# Patient Record
Sex: Female | Born: 1935 | Race: White | Hispanic: No | Marital: Married | Smoking: Former smoker
Health system: Southern US, Community
[De-identification: ages and names within clinical notes are randomized; demographics above are authoritative.]

## PROBLEM LIST (undated history)

## (undated) DIAGNOSIS — N289 Disorder of kidney and ureter, unspecified: Secondary | ICD-10-CM

## (undated) DIAGNOSIS — N183 Chronic kidney disease, stage 3 unspecified: Secondary | ICD-10-CM

## (undated) DIAGNOSIS — F329 Major depressive disorder, single episode, unspecified: Secondary | ICD-10-CM

## (undated) DIAGNOSIS — F209 Schizophrenia, unspecified: Secondary | ICD-10-CM

## (undated) DIAGNOSIS — D509 Iron deficiency anemia, unspecified: Secondary | ICD-10-CM

## (undated) DIAGNOSIS — E46 Unspecified protein-calorie malnutrition: Secondary | ICD-10-CM

## (undated) DIAGNOSIS — F32A Depression, unspecified: Secondary | ICD-10-CM

## (undated) DIAGNOSIS — I5041 Acute combined systolic (congestive) and diastolic (congestive) heart failure: Secondary | ICD-10-CM

## (undated) DIAGNOSIS — I509 Heart failure, unspecified: Secondary | ICD-10-CM

## (undated) DIAGNOSIS — I119 Hypertensive heart disease without heart failure: Secondary | ICD-10-CM

## (undated) DIAGNOSIS — W19XXXA Unspecified fall, initial encounter: Secondary | ICD-10-CM

## (undated) HISTORY — PX: ANKLE SURGERY: SHX546

---

## 2004-06-26 ENCOUNTER — Ambulatory Visit: Payer: Self-pay | Admitting: Family Medicine

## 2004-08-11 ENCOUNTER — Ambulatory Visit: Payer: Self-pay | Admitting: Internal Medicine

## 2004-08-17 ENCOUNTER — Ambulatory Visit: Payer: Self-pay | Admitting: Internal Medicine

## 2004-09-17 ENCOUNTER — Ambulatory Visit: Payer: Self-pay | Admitting: Internal Medicine

## 2004-10-17 ENCOUNTER — Ambulatory Visit: Payer: Self-pay | Admitting: Internal Medicine

## 2004-11-17 ENCOUNTER — Ambulatory Visit: Payer: Self-pay | Admitting: Internal Medicine

## 2004-12-18 ENCOUNTER — Ambulatory Visit: Payer: Self-pay | Admitting: Internal Medicine

## 2005-01-17 ENCOUNTER — Ambulatory Visit: Payer: Self-pay | Admitting: Internal Medicine

## 2005-02-17 ENCOUNTER — Ambulatory Visit: Payer: Self-pay | Admitting: Internal Medicine

## 2005-03-19 ENCOUNTER — Ambulatory Visit: Payer: Self-pay | Admitting: Internal Medicine

## 2005-04-19 ENCOUNTER — Ambulatory Visit: Payer: Self-pay | Admitting: Internal Medicine

## 2005-05-20 ENCOUNTER — Ambulatory Visit: Payer: Self-pay | Admitting: Internal Medicine

## 2005-06-17 ENCOUNTER — Ambulatory Visit: Payer: Self-pay | Admitting: General Practice

## 2005-07-01 ENCOUNTER — Ambulatory Visit: Payer: Self-pay | Admitting: Internal Medicine

## 2005-07-18 ENCOUNTER — Ambulatory Visit: Payer: Self-pay | Admitting: Internal Medicine

## 2005-08-17 ENCOUNTER — Ambulatory Visit: Payer: Self-pay | Admitting: Internal Medicine

## 2005-09-07 ENCOUNTER — Ambulatory Visit: Payer: Self-pay | Admitting: Family Medicine

## 2005-09-17 ENCOUNTER — Ambulatory Visit: Payer: Self-pay | Admitting: Internal Medicine

## 2005-10-17 ENCOUNTER — Ambulatory Visit: Payer: Self-pay | Admitting: Internal Medicine

## 2005-11-17 ENCOUNTER — Ambulatory Visit: Payer: Self-pay | Admitting: Internal Medicine

## 2005-12-18 ENCOUNTER — Ambulatory Visit: Payer: Self-pay | Admitting: Internal Medicine

## 2006-01-20 ENCOUNTER — Ambulatory Visit: Payer: Self-pay | Admitting: Internal Medicine

## 2006-03-03 ENCOUNTER — Ambulatory Visit: Payer: Self-pay | Admitting: Internal Medicine

## 2006-03-05 ENCOUNTER — Ambulatory Visit: Payer: Self-pay | Admitting: General Practice

## 2006-03-19 ENCOUNTER — Ambulatory Visit: Payer: Self-pay | Admitting: Internal Medicine

## 2006-04-19 ENCOUNTER — Ambulatory Visit: Payer: Self-pay | Admitting: Internal Medicine

## 2006-04-19 HISTORY — PX: OTHER SURGICAL HISTORY: SHX169

## 2006-05-26 ENCOUNTER — Ambulatory Visit: Payer: Self-pay | Admitting: Internal Medicine

## 2006-06-18 ENCOUNTER — Ambulatory Visit: Payer: Self-pay | Admitting: Internal Medicine

## 2006-07-19 ENCOUNTER — Ambulatory Visit: Payer: Self-pay | Admitting: Internal Medicine

## 2006-08-31 ENCOUNTER — Ambulatory Visit: Payer: Self-pay | Admitting: Internal Medicine

## 2006-09-09 ENCOUNTER — Ambulatory Visit: Payer: Self-pay | Admitting: Family Medicine

## 2006-09-18 ENCOUNTER — Ambulatory Visit: Payer: Self-pay | Admitting: Internal Medicine

## 2006-10-07 ENCOUNTER — Ambulatory Visit: Payer: Self-pay | Admitting: General Surgery

## 2006-10-07 LAB — HM COLONOSCOPY

## 2006-10-26 ENCOUNTER — Ambulatory Visit: Payer: Self-pay | Admitting: Internal Medicine

## 2006-11-18 ENCOUNTER — Ambulatory Visit: Payer: Self-pay | Admitting: Internal Medicine

## 2006-12-21 ENCOUNTER — Ambulatory Visit: Payer: Self-pay | Admitting: Internal Medicine

## 2007-01-18 ENCOUNTER — Ambulatory Visit: Payer: Self-pay | Admitting: Internal Medicine

## 2007-02-18 ENCOUNTER — Ambulatory Visit: Payer: Self-pay | Admitting: Internal Medicine

## 2007-03-20 ENCOUNTER — Ambulatory Visit: Payer: Self-pay | Admitting: Internal Medicine

## 2007-05-10 ENCOUNTER — Ambulatory Visit: Payer: Self-pay | Admitting: Internal Medicine

## 2007-05-21 ENCOUNTER — Ambulatory Visit: Payer: Self-pay | Admitting: Internal Medicine

## 2007-06-18 ENCOUNTER — Ambulatory Visit: Payer: Self-pay | Admitting: Internal Medicine

## 2007-07-19 ENCOUNTER — Ambulatory Visit: Payer: Self-pay | Admitting: Internal Medicine

## 2007-08-18 ENCOUNTER — Ambulatory Visit: Payer: Self-pay | Admitting: Internal Medicine

## 2007-08-30 ENCOUNTER — Ambulatory Visit: Payer: Self-pay | Admitting: Internal Medicine

## 2007-09-11 ENCOUNTER — Ambulatory Visit: Payer: Self-pay | Admitting: Family Medicine

## 2007-09-18 ENCOUNTER — Ambulatory Visit: Payer: Self-pay | Admitting: Internal Medicine

## 2007-10-18 ENCOUNTER — Ambulatory Visit: Payer: Self-pay | Admitting: Internal Medicine

## 2007-11-18 ENCOUNTER — Ambulatory Visit: Payer: Self-pay | Admitting: Internal Medicine

## 2007-12-20 ENCOUNTER — Ambulatory Visit: Payer: Self-pay | Admitting: Internal Medicine

## 2008-01-10 ENCOUNTER — Inpatient Hospital Stay (HOSPITAL_COMMUNITY): Admission: RE | Admit: 2008-01-10 | Discharge: 2008-01-11 | Payer: Self-pay | Admitting: Neurosurgery

## 2008-01-10 HISTORY — PX: OTHER SURGICAL HISTORY: SHX169

## 2008-01-16 ENCOUNTER — Inpatient Hospital Stay: Payer: Self-pay | Admitting: Internal Medicine

## 2008-01-18 ENCOUNTER — Ambulatory Visit: Payer: Self-pay | Admitting: Internal Medicine

## 2008-01-19 ENCOUNTER — Ambulatory Visit: Payer: Self-pay | Admitting: Internal Medicine

## 2008-01-19 ENCOUNTER — Encounter: Payer: Self-pay | Admitting: Internal Medicine

## 2008-02-18 ENCOUNTER — Ambulatory Visit: Payer: Self-pay | Admitting: Internal Medicine

## 2008-03-19 ENCOUNTER — Ambulatory Visit: Payer: Self-pay | Admitting: Internal Medicine

## 2008-04-19 ENCOUNTER — Ambulatory Visit: Payer: Self-pay | Admitting: Internal Medicine

## 2008-05-20 ENCOUNTER — Ambulatory Visit: Payer: Self-pay | Admitting: Internal Medicine

## 2008-06-17 ENCOUNTER — Ambulatory Visit: Payer: Self-pay | Admitting: Internal Medicine

## 2008-07-18 ENCOUNTER — Ambulatory Visit: Payer: Self-pay | Admitting: Internal Medicine

## 2008-08-17 ENCOUNTER — Ambulatory Visit: Payer: Self-pay | Admitting: Internal Medicine

## 2008-09-17 ENCOUNTER — Ambulatory Visit: Payer: Self-pay | Admitting: Internal Medicine

## 2008-11-11 ENCOUNTER — Ambulatory Visit: Payer: Self-pay | Admitting: Family Medicine

## 2008-11-13 ENCOUNTER — Ambulatory Visit: Payer: Self-pay | Admitting: Internal Medicine

## 2008-12-02 ENCOUNTER — Ambulatory Visit: Payer: Self-pay | Admitting: Nephrology

## 2008-12-11 ENCOUNTER — Ambulatory Visit: Payer: Self-pay | Admitting: Internal Medicine

## 2008-12-18 ENCOUNTER — Ambulatory Visit: Payer: Self-pay | Admitting: Internal Medicine

## 2009-01-17 ENCOUNTER — Ambulatory Visit: Payer: Self-pay | Admitting: Internal Medicine

## 2009-02-17 ENCOUNTER — Ambulatory Visit: Payer: Self-pay | Admitting: Internal Medicine

## 2009-03-19 ENCOUNTER — Ambulatory Visit: Payer: Self-pay | Admitting: Internal Medicine

## 2009-04-19 ENCOUNTER — Ambulatory Visit: Payer: Self-pay | Admitting: Internal Medicine

## 2009-05-28 ENCOUNTER — Ambulatory Visit: Payer: Self-pay | Admitting: Internal Medicine

## 2009-05-29 LAB — HM DEXA SCAN: HM DEXA SCAN: NORMAL

## 2009-06-17 ENCOUNTER — Ambulatory Visit: Payer: Self-pay | Admitting: Internal Medicine

## 2009-07-24 ENCOUNTER — Ambulatory Visit: Payer: Self-pay | Admitting: Internal Medicine

## 2009-08-17 ENCOUNTER — Ambulatory Visit: Payer: Self-pay | Admitting: Internal Medicine

## 2009-09-17 ENCOUNTER — Ambulatory Visit: Payer: Self-pay | Admitting: Internal Medicine

## 2009-10-17 ENCOUNTER — Ambulatory Visit: Payer: Self-pay | Admitting: Internal Medicine

## 2009-11-12 ENCOUNTER — Ambulatory Visit: Payer: Self-pay | Admitting: Internal Medicine

## 2009-11-14 ENCOUNTER — Ambulatory Visit: Payer: Self-pay | Admitting: Family Medicine

## 2009-11-14 LAB — HM MAMMOGRAPHY

## 2009-11-17 ENCOUNTER — Ambulatory Visit: Payer: Self-pay | Admitting: Internal Medicine

## 2009-12-18 ENCOUNTER — Ambulatory Visit: Payer: Self-pay | Admitting: Internal Medicine

## 2010-01-17 ENCOUNTER — Ambulatory Visit: Payer: Self-pay | Admitting: Internal Medicine

## 2010-03-11 ENCOUNTER — Ambulatory Visit: Payer: Self-pay | Admitting: Internal Medicine

## 2010-03-19 ENCOUNTER — Ambulatory Visit: Payer: Self-pay | Admitting: Internal Medicine

## 2010-05-06 ENCOUNTER — Ambulatory Visit: Payer: Self-pay | Admitting: Internal Medicine

## 2010-05-20 ENCOUNTER — Ambulatory Visit: Payer: Self-pay | Admitting: Internal Medicine

## 2010-07-01 ENCOUNTER — Ambulatory Visit: Payer: Self-pay | Admitting: Internal Medicine

## 2010-07-19 ENCOUNTER — Ambulatory Visit: Payer: Self-pay | Admitting: Internal Medicine

## 2010-08-27 ENCOUNTER — Ambulatory Visit: Payer: Self-pay | Admitting: Internal Medicine

## 2010-09-01 NOTE — Op Note (Signed)
Courtney Wright, BONNIE NO.:  [id number]   MEDICAL RECORD NO.:  [id number]          PATIENT TYPE:  INP   LOCATION:  2899                         FACILITY:  [facility]   PHYSICIAN:  Cristi Loron, M.D.DATE OF BIRTH:  1924-12-13   DATE OF PROCEDURE:  DATE OF DISCHARGE:                               OPERATIVE REPORT   BRIEF HISTORY:  The patient is a 77 year old white female who has  suffered from a neck and arm pain consistent with C5 and C6  radiculopathy.  She failed medical management work up with a cervical  MRI, which demonstrated the patient had spondylosis and stenosis at C4-  C5 and C5-C6.  I discussed the various treatment options with the  patient including surgery.  The patient has weighed the risks, benefits,  and alternatives of surgery, and decided to proceed with C4-C5 and C5-C6  anterior cervical diskectomy with fusion plating.   PREOPERATIVE DIAGNOSES:  C4-C5 and C5-C6 spondylosis, stenosis, cervical  radiculopathy, transverse myelopathy, and cervicalgia.   POSTOPERATIVE DIAGNOSES:  C4-C5 and C5-C6 spondylosis, stenosis,  cervical radiculopathy, transverse myelopathy, and cervicalgia.   PROCEDURES:  C4-C5 and C5-C6 extensive anterior cervical  diskectomy/decompression; C4-C5 and C5-C6 anterior interbody arthrodesis  with local autograft bone, and Actifuse bone graft extender; insertion  of interbody prosthesis at C4-C5 and C5-C6 (Alphatec PEEK interbody  prosthesis); anterior cervical plating C4-C6 with Codman SLIM-LOC  titanium plate and screws.   SURGEON:  Cristi Loron, MD   ASSISTANT:  Hewitt Shorts, MD   ANESTHESIA:  General endotracheal.   ESTIMATED BLOOD LOSS:  75 mL.   SPECIMENS:  None.   DRAINS:  None.   COMPLICATIONS:  None.   DESCRIPTION OF PROCEDURE:  The patient was brought to the operating room  by the Anesthesia Team.  General endotracheal anesthesia was induced.  The patient remained in the supine position.  A  roll was placed under  her shoulders and placed the neck in slight extension.  Anterior  cervical region was then prepared with Betadine scrub and Betadine  solution.  Sterile drapes were applied.  I then injected the area to mid-  size with Marcaine and with epinephrine solution.  I used a scalpel to  make a transverse incision in the patient's left anterior neck.  I then  used the Metzenbaum scissors to divide the platysmal muscle and then to  dissect the medial to sternocleidomastoid muscle, jugular vein, and  carotid artery.  I carefully dissected down towards the anterior  cervical spine identifying the esophagus and retracting it medially.  I  then used Kitner swabs to clear soft tissue from the anterior cervical  spine, and we then inserted a bent spinal needle into the upper exposed  intervertebral disk space.  We obtained intraoperative radiograph to  confirm our location.   We then used electrocautery to detach the medial border of the longus  colli muscle bilaterally from C4-C5 and C5-C6 intervertebral disk space.  We then inserted the Caspar self-retaining retractor underneath the  longus colli muscle bilaterally to provide exposure.   We began the decompression at C4-C5.  We  incised the C4-C5  intervertebral disk and performed a partial intervertebral diskectomy  using Carlens curettes and pituitary forceps.  We then inserted the  distraction screws at C4 and C5, distracted the interspace.  We then  used the high-speed drill to decorticate the vertebral endplates at C4  and C5.  Drilled away the remainder of C4-C5 intervertebral disk,  drilled away some posterior spondylosis, and to thin out the posterior  longitudinal ligament.  We then incised the ligament with a raccoon  knife and then removed using a Kerrison punch undercutting the vertebral  endplates and decompressing the thecal sac.  We then performed  foraminotomies about the bilateral C5 nerve root, completing  the  decompression at this level.   We then repeated this procedure in an analogous fashion at C5-C6  decompressing the C5-C6 thecal sac and bilateral C6 nerve roots.   After completing the decompression at C4-C5 and C5-C6, we now turned our  attention to the arthrodesis.  We used trial spacers and determined to  use a medium 4-mm PEEK interbody prosthesis at both levels.  We  prefilled this prosthesis with a combination of local autograft bone, we  obtained on the decompression as well as Actifuse bone graft extender.  We inserted the prostheses and distracted the C5-C6 and C4-C5  interspaces.  We then removed the distraction screws.  There was a good  snug fit of the prosthesis at both levels.   We now turned our attention to the anterior spinal instrumentation.  We  obtained the appropriate-length Codman SLIM-LOC anterior cervical plate.  We laid it along the anterior aspect of the vertebral bodies from C4-C6.  We drilled two 12-mm holes at C4-C5 and C5-C6 and then secured the plate  to the vertebral bodies by placing two 12-mm self-tapping screws at C4-  C5-C6.  We then obtained the intraoperative radiograph, it demonstrated  good position of the upper plate screws and interbody prosthesis (we  could not see the lower plate screws because of the patient's shoulder).  The construct looked good in vivo.  We therefore, secured the screws and  plate by locking each cam.   We then obtained hemostasis using bipolar electrocautery.  We irrigated  the wound out with bacitracin solution, removed the retractors, and  inspected the esophagus for any damage; there was none apparent.  We  then reapproximated the patient's platysma muscle with interrupted 3-0  Vicryl suture, the subcutaneous using interrupted 3-0 Vicryl suture, and  the skin with Steri-Strips and Benzoin.  The wound was then coated with  bacitracin ointment.  A sterile dressing was applied.  The drapes were  removed, and the  patient was subsequently extubated by the Anesthesia  Team and transported to the postanesthesia care unit in stable  condition.  All sponge, instrument, and needle counts were correct at  the end of this case.      Cristi Loron, M.D.  Electronically Signed     JDJ/MEDQ  D:  01/10/2008  T:  01/11/2008  Job:  [id number]

## 2010-09-18 ENCOUNTER — Ambulatory Visit: Payer: Self-pay | Admitting: Internal Medicine

## 2010-10-29 ENCOUNTER — Ambulatory Visit: Payer: Self-pay | Admitting: Internal Medicine

## 2010-11-18 ENCOUNTER — Ambulatory Visit: Payer: Self-pay | Admitting: Internal Medicine

## 2010-12-19 ENCOUNTER — Ambulatory Visit: Payer: Self-pay | Admitting: Internal Medicine

## 2011-01-18 LAB — CBC
Hemoglobin: 10.2 — ABNORMAL LOW
MCHC: 33.1
RBC: 3.48 — ABNORMAL LOW
WBC: 5

## 2011-01-18 LAB — BASIC METABOLIC PANEL
CO2: 28
Calcium: 9.6
GFR calc Af Amer: 39 — ABNORMAL LOW
Sodium: 137

## 2011-02-10 ENCOUNTER — Ambulatory Visit: Payer: Self-pay | Admitting: Internal Medicine

## 2011-02-18 ENCOUNTER — Ambulatory Visit: Payer: Self-pay | Admitting: Internal Medicine

## 2011-04-07 ENCOUNTER — Ambulatory Visit: Payer: Self-pay | Admitting: Internal Medicine

## 2011-04-20 ENCOUNTER — Ambulatory Visit: Payer: Self-pay | Admitting: Internal Medicine

## 2011-06-02 ENCOUNTER — Ambulatory Visit: Payer: Self-pay | Admitting: Internal Medicine

## 2011-06-02 LAB — CANCER CENTER HEMOGLOBIN: HGB: 10.6 g/dL — ABNORMAL LOW (ref 12.0–16.0)

## 2011-06-18 ENCOUNTER — Ambulatory Visit: Payer: Self-pay | Admitting: Internal Medicine

## 2011-07-28 ENCOUNTER — Ambulatory Visit: Payer: Self-pay | Admitting: Internal Medicine

## 2011-07-28 LAB — IRON AND TIBC
Iron Bind.Cap.(Total): 351 ug/dL (ref 250–450)
Iron: 61 ug/dL (ref 50–170)

## 2011-07-28 LAB — FERRITIN: Ferritin (ARMC): 36 ng/mL (ref 8–388)

## 2011-08-18 ENCOUNTER — Ambulatory Visit: Payer: Self-pay | Admitting: Internal Medicine

## 2011-09-22 ENCOUNTER — Ambulatory Visit: Payer: Self-pay | Admitting: Internal Medicine

## 2011-09-22 LAB — CBC CANCER CENTER
Basophil #: 0 x10 3/mm (ref 0.0–0.1)
Eosinophil #: 0.1 x10 3/mm (ref 0.0–0.7)
Eosinophil %: 2.1 %
Lymphocyte #: 2 x10 3/mm (ref 1.0–3.6)
MCH: 29 pg (ref 26.0–34.0)
MCHC: 32.1 g/dL (ref 32.0–36.0)
MCV: 90 fL (ref 80–100)
Monocyte #: 0.9 x10 3/mm (ref 0.2–0.9)
Neutrophil %: 45.7 %

## 2011-10-18 ENCOUNTER — Ambulatory Visit: Payer: Self-pay | Admitting: Internal Medicine

## 2011-12-15 ENCOUNTER — Ambulatory Visit: Payer: Self-pay | Admitting: Internal Medicine

## 2011-12-19 ENCOUNTER — Ambulatory Visit: Payer: Self-pay | Admitting: Internal Medicine

## 2012-03-08 ENCOUNTER — Ambulatory Visit: Payer: Self-pay | Admitting: Internal Medicine

## 2012-03-08 LAB — IRON AND TIBC
Iron Saturation: 21 %
Iron: 78 ug/dL (ref 50–170)

## 2012-03-08 LAB — FERRITIN: Ferritin (ARMC): 43 ng/mL (ref 8–388)

## 2012-03-08 LAB — CANCER CENTER HEMOGLOBIN: HGB: 10.7 g/dL — ABNORMAL LOW (ref 12.0–16.0)

## 2012-03-19 ENCOUNTER — Ambulatory Visit: Payer: Self-pay | Admitting: Internal Medicine

## 2012-05-20 ENCOUNTER — Ambulatory Visit: Payer: Self-pay | Admitting: Internal Medicine

## 2012-06-17 ENCOUNTER — Ambulatory Visit: Payer: Self-pay | Admitting: Internal Medicine

## 2012-08-17 ENCOUNTER — Ambulatory Visit: Payer: Self-pay | Admitting: Internal Medicine

## 2012-08-23 LAB — CBC CANCER CENTER
Basophil %: 0.8 %
HCT: 31 % — ABNORMAL LOW (ref 35.0–47.0)
HGB: 10.2 g/dL — ABNORMAL LOW (ref 12.0–16.0)
Lymphocyte #: 1.6 x10 3/mm (ref 1.0–3.6)
Lymphocyte %: 36.3 %
Monocyte #: 0.8 x10 3/mm (ref 0.2–0.9)
Monocyte %: 17.9 %
Neutrophil #: 1.8 x10 3/mm (ref 1.4–6.5)
RBC: 3.43 10*6/uL — ABNORMAL LOW (ref 3.80–5.20)

## 2012-09-17 ENCOUNTER — Ambulatory Visit: Payer: Self-pay | Admitting: Internal Medicine

## 2012-10-17 ENCOUNTER — Ambulatory Visit: Payer: Self-pay | Admitting: Internal Medicine

## 2012-11-15 LAB — IRON AND TIBC
Iron Bind.Cap.(Total): 365 ug/dL (ref 250–450)
Unbound Iron-Bind.Cap.: 286 ug/dL

## 2012-11-15 LAB — FERRITIN: Ferritin (ARMC): 44 ng/mL (ref 8–388)

## 2012-11-17 ENCOUNTER — Ambulatory Visit: Payer: Self-pay | Admitting: Internal Medicine

## 2013-02-07 ENCOUNTER — Ambulatory Visit: Payer: Self-pay | Admitting: Internal Medicine

## 2013-02-07 LAB — CANCER CENTER HEMOGLOBIN: HGB: 10.9 g/dL — ABNORMAL LOW (ref 12.0–16.0)

## 2013-02-17 ENCOUNTER — Ambulatory Visit: Payer: Self-pay | Admitting: Internal Medicine

## 2013-05-09 ENCOUNTER — Ambulatory Visit: Payer: Self-pay | Admitting: Internal Medicine

## 2013-05-09 LAB — IRON AND TIBC
IRON: 80 ug/dL (ref 50–170)
Iron Bind.Cap.(Total): 383 ug/dL (ref 250–450)
Iron Saturation: 21 %
Unbound Iron-Bind.Cap.: 303 ug/dL

## 2013-05-09 LAB — CANCER CENTER HEMOGLOBIN: HGB: 10.7 g/dL — AB (ref 12.0–16.0)

## 2013-05-09 LAB — FERRITIN: Ferritin (ARMC): 25 ng/mL (ref 8–388)

## 2013-05-20 ENCOUNTER — Ambulatory Visit: Payer: Self-pay | Admitting: Internal Medicine

## 2013-07-24 ENCOUNTER — Ambulatory Visit: Payer: Self-pay | Admitting: Internal Medicine

## 2013-07-25 LAB — CBC CANCER CENTER
BASOS ABS: 0 x10 3/mm (ref 0.0–0.1)
Basophil %: 0.8 %
EOS PCT: 2.8 %
Eosinophil #: 0.2 x10 3/mm (ref 0.0–0.7)
HCT: 32.4 % — AB (ref 35.0–47.0)
HGB: 10.3 g/dL — AB (ref 12.0–16.0)
Lymphocyte #: 1.8 x10 3/mm (ref 1.0–3.6)
Lymphocyte %: 30.5 %
MCH: 28.6 pg (ref 26.0–34.0)
MCHC: 31.6 g/dL — ABNORMAL LOW (ref 32.0–36.0)
MCV: 91 fL (ref 80–100)
Monocyte #: 1 x10 3/mm — ABNORMAL HIGH (ref 0.2–0.9)
Monocyte %: 15.9 %
NEUTROS ABS: 3 x10 3/mm (ref 1.4–6.5)
Neutrophil %: 50 %
Platelet: 186 x10 3/mm (ref 150–440)
RBC: 3.58 10*6/uL — ABNORMAL LOW (ref 3.80–5.20)
RDW: 13.3 % (ref 11.5–14.5)
WBC: 6 x10 3/mm (ref 3.6–11.0)

## 2013-08-17 ENCOUNTER — Ambulatory Visit: Payer: Self-pay | Admitting: Internal Medicine

## 2014-11-04 ENCOUNTER — Other Ambulatory Visit: Payer: Self-pay | Admitting: Family Medicine

## 2014-11-04 DIAGNOSIS — M1990 Unspecified osteoarthritis, unspecified site: Secondary | ICD-10-CM | POA: Insufficient documentation

## 2014-11-04 MED ORDER — OXYCODONE-ACETAMINOPHEN 10-325 MG PO TABS: ORAL_TABLET | ORAL | Status: DC

## 2014-11-04 NOTE — Telephone Encounter (Signed)
Last ov 12/03/13. Last printed rx on 09/09/14

## 2014-11-04 NOTE — Telephone Encounter (Signed)
Needs ov. Thanks.  

## 2014-11-04 NOTE — Telephone Encounter (Signed)
Hydrocodone 10/325 mg Pt needs to pick up RX  Her daughter will pick it up  [facility]  Thanks TP

## 2014-11-11 ENCOUNTER — Encounter: Payer: Self-pay | Admitting: Family Medicine

## 2014-11-11 ENCOUNTER — Telehealth: Payer: Self-pay

## 2014-11-11 ENCOUNTER — Ambulatory Visit (INDEPENDENT_AMBULATORY_CARE_PROVIDER_SITE_OTHER): Payer: Medicare Other | Admitting: Family Medicine

## 2014-11-11 VITALS — BP 140/78 | HR 78 | Temp 98.0°F | Resp 15 | Wt 168.6 lb

## 2014-11-11 DIAGNOSIS — R079 Chest pain, unspecified: Secondary | ICD-10-CM | POA: Insufficient documentation

## 2014-11-11 DIAGNOSIS — R002 Palpitations: Secondary | ICD-10-CM

## 2014-11-11 DIAGNOSIS — K5900 Constipation, unspecified: Secondary | ICD-10-CM | POA: Insufficient documentation

## 2014-11-11 DIAGNOSIS — N183 Chronic kidney disease, stage 3 unspecified: Secondary | ICD-10-CM | POA: Insufficient documentation

## 2014-11-11 DIAGNOSIS — D649 Anemia, unspecified: Secondary | ICD-10-CM | POA: Diagnosis not present

## 2014-11-11 DIAGNOSIS — D631 Anemia in chronic kidney disease: Secondary | ICD-10-CM

## 2014-11-11 DIAGNOSIS — R05 Cough: Secondary | ICD-10-CM | POA: Insufficient documentation

## 2014-11-11 DIAGNOSIS — F419 Anxiety disorder, unspecified: Secondary | ICD-10-CM | POA: Diagnosis not present

## 2014-11-11 DIAGNOSIS — R0602 Shortness of breath: Secondary | ICD-10-CM | POA: Insufficient documentation

## 2014-11-11 DIAGNOSIS — R42 Dizziness and giddiness: Secondary | ICD-10-CM | POA: Diagnosis not present

## 2014-11-11 DIAGNOSIS — R569 Unspecified convulsions: Secondary | ICD-10-CM | POA: Insufficient documentation

## 2014-11-11 DIAGNOSIS — R609 Edema, unspecified: Secondary | ICD-10-CM | POA: Insufficient documentation

## 2014-11-11 DIAGNOSIS — M549 Dorsalgia, unspecified: Secondary | ICD-10-CM

## 2014-11-11 DIAGNOSIS — I10 Essential (primary) hypertension: Secondary | ICD-10-CM | POA: Diagnosis not present

## 2014-11-11 DIAGNOSIS — N189 Chronic kidney disease, unspecified: Secondary | ICD-10-CM | POA: Insufficient documentation

## 2014-11-11 DIAGNOSIS — R059 Cough, unspecified: Secondary | ICD-10-CM | POA: Insufficient documentation

## 2014-11-11 MED ORDER — CITALOPRAM HYDROBROMIDE 20 MG PO TABS: 20.0000 mg | ORAL_TABLET | Freq: Every day | ORAL | Status: DC

## 2014-11-11 NOTE — Telephone Encounter (Signed)
Called Citalopram(CELEXA) 20 mg prescription  to [facility].

## 2014-11-11 NOTE — Progress Notes (Signed)
   Subjective:    Patient ID: Courtney Wright, female    DOB: 1924-10-03, 81 y.o.   MRN: [id number]  HPI  Courtney Wright is here with chief complain of not been feeling well for the past six months, feeling light-headeness, weakness on and off.  Now for the past weeks the spells have been more frequently.Usually appetite is not good, but now is getting worst. Patient feels anxious when feeling dizzy and can feel heart rate increasing.    Review of Systems  Constitutional: Positive for appetite change and fatigue. Chills: appetite is a lot worst than usual.  HENT: Negative.  Negative for ear discharge and ear pain.   Eyes: Negative.   Respiratory: Positive for cough (cough some).   Cardiovascular: Positive for palpitations (started two weeks ago but this weak is more frequent).  Gastrointestinal: Negative.   Endocrine: Negative.   Genitourinary: Negative.   Musculoskeletal: Negative.   Skin: Positive for color change (looking a little pale).  Allergic/Immunologic: Negative.   Neurological: Positive for dizziness (Have been having spells for two weeks on and off, but this weak is more frequently.), weakness and light-headedness (for 6 weeks).  Hematological: Negative.   Psychiatric/Behavioral: The patient is nervous/anxious (when having the spells).       Objective:   Physical Exam  Constitutional: She is oriented to person, place, and time. She appears well-developed and well-nourished.  Cardiovascular: Normal rate and regular rhythm.   Pulmonary/Chest: Effort normal and breath sounds normal.  Neurological: She is alert and oriented to person, place, and time.  Skin: Skin is warm.   BP 140/78 mmHg  Pulse 78  Temp(Src) 98 F (36.7 C) (Oral)  Resp 15  Wt 168 lb 9.6 oz (76.476 kg)  SpO2 96%      Assessment & Plan:  1. Dizziness Really more lightheaded when SOB.   2. Shortness of breath Will check labs. Has had previously. Will check labs.  - CBC with  Differential/Platelet - Comprehensive metabolic panel  3. Palpitation Suspect related to anxiety. EKG ok today. Will monitor. ER if has symptoms that worsen or do not resolve with rest.   - EKG 12-Lead - TSH  4. Anxiety Suspect cause of symptoms. Has had similar previously.  - citalopram (CELEXA) 20 MG tablet; Take 1 tablet (20 mg total) by mouth daily.  Dispense: 30 tablet; Refill: 3  5. Essential hypertension Stable. Will check labs.  - Comprehensive metabolic panel  6. Normocytic anemia Recheck labs.  - CBC with Differential/Platelet  Recheck ov in 2 weeks.   Lorie Phenix, MD

## 2014-11-12 LAB — COMPREHENSIVE METABOLIC PANEL
A/G RATIO: 1.4 (ref 1.1–2.5)
ALBUMIN: 4.1 g/dL (ref 3.5–4.7)
ALT: 10 IU/L (ref 0–32)
AST: 23 IU/L (ref 0–40)
Alkaline Phosphatase: 59 IU/L (ref 39–117)
BUN / CREAT RATIO: 19 (ref 11–26)
BUN: 20 mg/dL (ref 8–27)
Bilirubin Total: 0.5 mg/dL (ref 0.0–1.2)
CHLORIDE: 99 mmol/L (ref 97–108)
CO2: 22 mmol/L (ref 18–29)
Calcium: 9.9 mg/dL (ref 8.7–10.3)
Creatinine, Ser: 1.08 mg/dL — ABNORMAL HIGH (ref 0.57–1.00)
GFR, EST AFRICAN AMERICAN: 53 mL/min/{1.73_m2} — AB (ref >59)
GFR, EST NON AFRICAN AMERICAN: 46 mL/min/{1.73_m2} — AB (ref >59)
GLOBULIN, TOTAL: 3 g/dL (ref 1.5–4.5)
GLUCOSE: 101 mg/dL — AB (ref 65–99)
Potassium: 4.6 mmol/L (ref 3.5–5.2)
Sodium: 141 mmol/L (ref 134–144)
Total Protein: 7.1 g/dL (ref 6.0–8.5)

## 2014-11-12 LAB — CBC WITH DIFFERENTIAL/PLATELET
BASOS: 0 %
Basophils Absolute: 0 10*3/uL (ref 0.0–0.2)
EOS (ABSOLUTE): 0.1 10*3/uL (ref 0.0–0.4)
EOS: 1 %
Hematocrit: 33.3 % — ABNORMAL LOW (ref 34.0–46.6)
Hemoglobin: 10.5 g/dL — ABNORMAL LOW (ref 11.1–15.9)
IMMATURE GRANS (ABS): 0 10*3/uL (ref 0.0–0.1)
IMMATURE GRANULOCYTES: 0 %
Lymphocytes Absolute: 1.8 10*3/uL (ref 0.7–3.1)
Lymphs: 26 %
MCH: 28.6 pg (ref 26.6–33.0)
MCHC: 31.5 g/dL (ref 31.5–35.7)
MCV: 91 fL (ref 79–97)
MONOS ABS: 1.1 10*3/uL — AB (ref 0.1–0.9)
Monocytes: 16 %
NEUTROS PCT: 57 %
Neutrophils Absolute: 3.8 10*3/uL (ref 1.4–7.0)
PLATELETS: 267 10*3/uL (ref 150–379)
RBC: 3.67 x10E6/uL — AB (ref 3.77–5.28)
RDW: 13.3 % (ref 12.3–15.4)
WBC: 6.8 10*3/uL (ref 3.4–10.8)

## 2014-11-12 LAB — TSH: TSH: 0.204 u[IU]/mL — ABNORMAL LOW (ref 0.450–4.500)

## 2014-11-13 ENCOUNTER — Telehealth: Payer: Self-pay

## 2014-11-13 NOTE — Telephone Encounter (Signed)
Advised pt of lab results. Pt verbally acknowledges understanding. Kylian Loh Drozdowski, CMA   

## 2014-11-25 ENCOUNTER — Telehealth: Payer: Self-pay | Admitting: Family Medicine

## 2014-11-25 DIAGNOSIS — E0590 Thyrotoxicosis, unspecified without thyrotoxic crisis or storm: Secondary | ICD-10-CM | POA: Insufficient documentation

## 2014-11-25 NOTE — Telephone Encounter (Signed)
Printed.  Please notify patient. Thanks.  

## 2014-11-25 NOTE — Telephone Encounter (Signed)
Pt would like to have lab orders sent to [facility] to have thyroid rechecked due to high value on the last test.  Please contact patient when req is ready.

## 2014-11-25 NOTE — Telephone Encounter (Signed)
Lynn advised.   Thanks,   -Laura  

## 2014-11-26 ENCOUNTER — Telehealth: Payer: Self-pay

## 2014-11-26 LAB — T4 AND TSH
T4, Total: 7.4 ug/dL (ref 4.5–12.0)
TSH: 0.348 u[IU]/mL — ABNORMAL LOW (ref 0.450–4.500)

## 2014-11-26 NOTE — Telephone Encounter (Signed)
Spoke with pt regarding the note below, pt verbalized fully understanding.  Thanks,

## 2014-11-26 NOTE — Telephone Encounter (Signed)
-----   Message from Lorie Phenix, MD sent at 11/26/2014  6:43 AM EDT ----- Thyroid stable. Would recheck in 3 months for stability. Thanks.

## 2014-12-12 ENCOUNTER — Encounter: Payer: Self-pay | Admitting: Family Medicine

## 2014-12-12 ENCOUNTER — Ambulatory Visit (INDEPENDENT_AMBULATORY_CARE_PROVIDER_SITE_OTHER): Payer: Medicare Other | Admitting: Family Medicine

## 2014-12-12 VITALS — BP 148/86 | HR 80 | Temp 98.2°F | Resp 16 | Wt 175.0 lb

## 2014-12-12 DIAGNOSIS — D649 Anemia, unspecified: Secondary | ICD-10-CM

## 2014-12-12 DIAGNOSIS — F419 Anxiety disorder, unspecified: Secondary | ICD-10-CM | POA: Diagnosis not present

## 2014-12-12 DIAGNOSIS — E0590 Thyrotoxicosis, unspecified without thyrotoxic crisis or storm: Secondary | ICD-10-CM | POA: Diagnosis not present

## 2014-12-12 DIAGNOSIS — I10 Essential (primary) hypertension: Secondary | ICD-10-CM | POA: Diagnosis not present

## 2014-12-12 DIAGNOSIS — M549 Dorsalgia, unspecified: Secondary | ICD-10-CM

## 2014-12-12 MED ORDER — OXYCODONE-ACETAMINOPHEN 10-325 MG PO TABS: ORAL_TABLET | ORAL | Status: DC

## 2014-12-12 NOTE — Progress Notes (Signed)
Subjective:    Patient ID: Courtney Wright, female    DOB: 03-Jun-1924, 81 y.o.   MRN: [id number]  Dizziness Chronicity: 1 month FU. The problem has been rapidly improving. Associated symptoms include anorexia (decreased appetite), arthralgias (knees) and headaches (occasionally). Pertinent negatives include no abdominal pain, change in bowel habit, chest pain, chills, congestion, coughing, diaphoresis, fatigue, fever, joint swelling, myalgias, nausea, neck pain, numbness, rash, sore throat, swollen glands, urinary symptoms, vertigo (improved since LOV), visual change, vomiting or weakness. Treatments tried: increased Celexa to 20 mg. The treatment provided moderate relief.  Anxiety Presents for follow-up (increased Celexa to 20 mg at LOV) visit. Symptoms include depressed mood, malaise and nervous/anxious behavior. Patient reports no chest pain, confusion, decreased concentration, dizziness, dry mouth, excessive worry, feeling of choking, hyperventilation, irritability, muscle tension, nausea, palpitations, panic, restlessness, shortness of breath or suicidal ideas.   Her past medical history is significant for anxiety/panic attacks. Compliance with prior treatments has been good.   Did not take BP medication today.     Review of Systems  Constitutional: Negative for fever, chills, diaphoresis, irritability and fatigue.  HENT: Negative for congestion and sore throat.   Respiratory: Negative for cough and shortness of breath.   Cardiovascular: Positive for leg swelling. Negative for chest pain and palpitations.  Gastrointestinal: Positive for anorexia (decreased appetite). Negative for nausea, vomiting, abdominal pain and change in bowel habit.  Musculoskeletal: Positive for arthralgias (knees). Negative for myalgias, joint swelling and neck pain.  Skin: Negative for rash.  Neurological: Positive for headaches (occasionally). Negative for dizziness, vertigo (improved since LOV), weakness and  numbness.  Psychiatric/Behavioral: Negative for suicidal ideas, confusion and decreased concentration. The patient is nervous/anxious.    BP 148/86 mmHg  Pulse 80  Temp(Src) 98.2 F (36.8 C) (Oral)  Resp 16  Wt 175 lb (79.379 kg)   Patient Active Problem List   Diagnosis Date Noted  . Subclinical hyperthyroidism 11/25/2014  . Anemia associated with chronic renal failure 11/11/2014  . Anxiety 11/11/2014  . Back ache 11/11/2014  . Chest pain 11/11/2014  . Chronic kidney disease 11/11/2014  . CN (constipation) 11/11/2014  . Cough 11/11/2014  . Accumulation of fluid in tissues 11/11/2014  . BP (high blood pressure) 11/11/2014  . Normocytic anemia 11/11/2014  . Breath shortness 11/11/2014  . Convulsions 11/11/2014  . Back pain 11/11/2014  . DJD (degenerative joint disease) 11/04/2014   No past medical history on file. Current Outpatient Prescriptions on File Prior to Visit  Medication Sig  . amLODipine (NORVASC) 2.5 MG tablet Take by mouth.  . citalopram (CELEXA) 20 MG tablet Take 1 tablet (20 mg total) by mouth daily.  . diclofenac sodium (VOLTAREN) 1 % GEL Place onto the skin.  . furosemide (LASIX) 40 MG tablet Take 40 mg by mouth daily.  Marland Kitchen lisinopril (PRINIVIL,ZESTRIL) 40 MG tablet Take by mouth.  . metolazone (ZAROXOLYN) 2.5 MG tablet Take by mouth.  . metoprolol tartrate (LOPRESSOR) 25 MG tablet Take 25 mg by mouth daily.  Marland Kitchen oxyCODONE-acetaminophen (PERCOCET) 10-325 MG per tablet 1 tablet three times daily by mouth.   No current facility-administered medications on file prior to visit.   Allergies  Allergen Reactions  . Anaprox  [Naproxen]   . Fenoprofen Calcium    Past Surgical History  Procedure Laterality Date  . History of cervical discectomy  01/10/2008    Dr. Lovell Sheehan, [facility]; Fusion and Plating at C4-5 and 5-6  . History of cataract surgery  2008  . Ankle  surgery Left    Social History   Social History  . Marital Status: Married    Spouse Name: N/A   . Number of Children: N/A  . Years of Education: N/A   Occupational History  . Not on file.   Social History Main Topics  . Smoking status: Former Smoker    Quit date: 04/18/1969  . Smokeless tobacco: Never Used  . Alcohol Use: 0.0 oz/week    0 Standard drinks or equivalent per week     Comment: ocassional  . Drug Use: No  . Sexual Activity: Not on file   Other Topics Concern  . Not on file   Social History Narrative   Family History  Problem Relation Age of Onset  . Dementia Mother   . Alzheimer's disease Mother   . Cerebrovascular Accident Father   . Congestive Heart Failure Father   . Rheumatic fever Father   . Heart disease Sister   . Arthritis Sister   . Diabetes Brother     borderline diabetes  . Cancer Brother     Laryngeal Cancer and breast        Objective:   Physical Exam  Constitutional: She is oriented to person, place, and time. She appears well-developed and well-nourished.  Cardiovascular: Normal rate and regular rhythm.   Pulmonary/Chest: Effort normal and breath sounds normal.  Neurological: She is alert and oriented to person, place, and time.  Psychiatric: She has a normal mood and affect. Her behavior is normal. Judgment and thought content normal.   BP 148/86 mmHg  Pulse 80  Temp(Src) 98.2 F (36.8 C) (Oral)  Resp 16  Wt 175 lb (79.379 kg)     Assessment & Plan:  1. Essential hypertension Stable. Will continue current medication.   2. Subclinical hyperthyroidism Stable. Recheck in 3 months.   3. Normocytic anemia Stable.   4. Anxiety Greatly improved on increased medication. Spells have resolved. Recheck in 3 months.    5. Back pain, unspecified location Unchanged. Refilled medication for 3 months.   - oxyCODONE-acetaminophen (PERCOCET) 10-325 MG per tablet; 1 tablet three times daily by mouth.  Dispense: 90 tablet; Refill: 0  Lorie Phenix, MD

## 2015-03-18 ENCOUNTER — Ambulatory Visit: Payer: Medicare Other | Admitting: Family Medicine

## 2015-03-31 ENCOUNTER — Other Ambulatory Visit: Payer: Self-pay | Admitting: Family Medicine

## 2015-03-31 ENCOUNTER — Ambulatory Visit: Payer: Self-pay | Admitting: Family Medicine

## 2015-03-31 ENCOUNTER — Ambulatory Visit: Payer: Medicare Other | Admitting: Family Medicine

## 2015-03-31 DIAGNOSIS — M549 Dorsalgia, unspecified: Secondary | ICD-10-CM

## 2015-03-31 MED ORDER — OXYCODONE-ACETAMINOPHEN 10-325 MG PO TABS: ORAL_TABLET | ORAL | Status: DC

## 2015-03-31 NOTE — Telephone Encounter (Signed)
Prescription printed. Please notify patient it is ready for pick up. Thanks- Dr. Marquis Diles.  

## 2015-03-31 NOTE — Telephone Encounter (Signed)
Pt contacted office for refill request on the following medications:  oxyCODONE-acetaminophen (PERCOCET) 10-325 MG per tablet.  CB#[phone]/MW  Pt is requesting to pick these up tomorrow if possible/MW

## 2015-04-01 ENCOUNTER — Ambulatory Visit: Payer: Self-pay | Admitting: Family Medicine

## 2015-04-09 ENCOUNTER — Ambulatory Visit: Payer: Self-pay | Admitting: Family Medicine

## 2015-06-26 ENCOUNTER — Other Ambulatory Visit: Payer: Self-pay | Admitting: Family Medicine

## 2015-06-26 DIAGNOSIS — F419 Anxiety disorder, unspecified: Secondary | ICD-10-CM

## 2015-07-22 ENCOUNTER — Other Ambulatory Visit: Payer: Self-pay | Admitting: Family Medicine

## 2015-07-22 DIAGNOSIS — M549 Dorsalgia, unspecified: Secondary | ICD-10-CM

## 2015-07-22 MED ORDER — OXYCODONE-ACETAMINOPHEN 10-325 MG PO TABS: ORAL_TABLET | ORAL | Status: DC

## 2015-07-22 NOTE — Telephone Encounter (Signed)
Pt's daughter Larita FifeLynn contacted office for refill request on the following medications: oxyCODONE-acetaminophen (PERCOCET) 10-325 MG tablet. Larita FifeLynn would like to pick up the RX on Thursday 07/24/15. Thanks TNP

## 2015-07-22 NOTE — Telephone Encounter (Signed)
Patient was last seen on 12/12/2014. Medication was last written on 03/31/2015 #90 and was last filled on 06/01/2015 (you printed a 3 month supply for patient).

## 2015-07-22 NOTE — Telephone Encounter (Signed)
Prescription printed. Please notify patient it is ready for pick up. Thanks- Dr. Giorgi Debruin.  

## 2015-09-08 ENCOUNTER — Ambulatory Visit (INDEPENDENT_AMBULATORY_CARE_PROVIDER_SITE_OTHER): Payer: Medicare Other | Admitting: Family Medicine

## 2015-09-08 ENCOUNTER — Encounter: Payer: Self-pay | Admitting: Family Medicine

## 2015-09-08 VITALS — BP 140/80 | HR 72 | Temp 98.2°F | Resp 16 | Wt 168.0 lb

## 2015-09-08 DIAGNOSIS — I10 Essential (primary) hypertension: Secondary | ICD-10-CM | POA: Diagnosis not present

## 2015-09-08 DIAGNOSIS — D649 Anemia, unspecified: Secondary | ICD-10-CM | POA: Diagnosis not present

## 2015-09-08 DIAGNOSIS — E0590 Thyrotoxicosis, unspecified without thyrotoxic crisis or storm: Secondary | ICD-10-CM

## 2015-09-08 MED ORDER — DICLOFENAC SODIUM 1 % TD GEL: 2.0000 g | Freq: Four times a day (QID) | TRANSDERMAL | Status: DC

## 2015-09-08 NOTE — Progress Notes (Signed)
Patient ID: Courtney Wright, female   DOB: Oct 21, 1924, 82 y.o.   MRN: [id number]       Patient: Courtney Wright Female    DOB: Nov 15, 1924   82 y.o.   MRN: [id number] Visit Date: 09/08/2015  Today's Provider: Lorie Phenix, MD   Chief Complaint  Patient presents with  . Hypothyroidism  . Anxiety  . Hypertension   Subjective:    HPI  Hyperthyroid, follow-up:  TSH  Date Value Ref Range Status  11/25/2014 0.348* 0.450 - 4.500 uIU/mL Final  11/11/2014 0.204* 0.450 - 4.500 uIU/mL Final   Wt Readings from Last 3 Encounters:  09/08/15 168 lb (76.204 kg)  12/12/14 175 lb (79.379 kg)  11/11/14 168 lb 9.6 oz (76.476 kg)    She was last seen for hyperthyroid 9 months ago.  Management since that visit includes no changes. She reports no oral treatment. She is not exercising. She is experiencing none She denies none Weight trend: stable  ------------------------------------------------------------------------  Anxiety follow-up: Patient here to follow-up on anxiety disorder.  She has the following symptoms: none. Onset of symptoms was approximately several months ago, stable since that time. She denies current suicidal and homicidal ideation. Family history significant for no psychiatric illness.Possible organic causes contributing are: none. Risk factors: none   She complains of the following side effects from the treatment: none.   Hypertension, follow-up:  BP Readings from Last 3 Encounters:  09/08/15 140/80  12/12/14 148/86  11/11/14 140/78    She was last seen for hypertension 9 months ago.  BP at that visit was 148/86. Management changes since that visit include no changes. She reports fair compliance with treatment. She is not having side effects. She is not exercising. She is adherent to low salt diet.   Outside blood pressures are stable. She is experiencing none.  Patient denies chest pain.   Cardiovascular risk factors include advanced age (older than 15  for men, 34 for women).  Use of agents associated with hypertension: none.     Weight trend: stable Wt Readings from Last 3 Encounters:  09/08/15 168 lb (76.204 kg)  12/12/14 175 lb (79.379 kg)  11/11/14 168 lb 9.6 oz (76.476 kg)    Current diet: in general, a "healthy" diet    ------------------------------------------------------------------------     Allergies  Allergen Reactions  . Anaprox  [Naproxen]   . Fenoprofen Calcium    Previous Medications   AMLODIPINE (NORVASC) 2.5 MG TABLET    Take 2.5 mg by mouth daily.    CITALOPRAM (CELEXA) 20 MG TABLET    TAKE ONE (1) TABLET BY MOUTH EVERY DAY   DICLOFENAC SODIUM (VOLTAREN) 1 % GEL    Place onto the skin. Reported on 09/08/2015   FUROSEMIDE (LASIX) 40 MG TABLET    Take 40 mg by mouth daily.   LISINOPRIL (PRINIVIL,ZESTRIL) 40 MG TABLET    Take 40 mg by mouth daily.    METOLAZONE (ZAROXOLYN) 2.5 MG TABLET    Take 2.5 mg by mouth as needed.    METOPROLOL TARTRATE (LOPRESSOR) 25 MG TABLET    Take 25 mg by mouth daily.   OXYCODONE-ACETAMINOPHEN (PERCOCET) 10-325 MG TABLET    1 tablet three times daily by mouth.    Review of Systems  Constitutional: Negative.   Respiratory: Negative.   Cardiovascular: Positive for leg swelling.  Musculoskeletal: Positive for arthralgias.    Social History  Substance Use Topics  . Smoking status: Former Smoker    Quit date: 04/18/1969  .  Smokeless tobacco: Never Used  . Alcohol Use: 0.0 oz/week    0 Standard drinks or equivalent per week     Comment: ocassional   Objective:   BP 140/80 mmHg  Pulse 72  Temp(Src) 98.2 F (36.8 C) (Oral)  Resp 16  Wt 168 lb (76.204 kg)  Physical Exam  Constitutional: She is oriented to person, place, and time. She appears well-developed and well-nourished.  Cardiovascular: Normal rate, regular rhythm and normal heart sounds.   Pulmonary/Chest: Effort normal and breath sounds normal.  Musculoskeletal:  Unsteady gait. Walking slowly with cane.     Neurological: She is oriented to person, place, and time.  Psychiatric: She has a normal mood and affect. Her behavior is normal. Judgment and thought content normal.      Assessment & Plan:     1. Essential hypertension Stable. Patient advised to continue current medication and plan of care. - CBC with Differential/Platelet - Comprehensive metabolic panel  2. Subclinical hyperthyroidism F/U pending lab report. - Thyroid Panel With TSH  3. Normocytic anemia F/U pending lab report. - Ferritin - Iron and TIBC    Patient seen and examined by Dr. Leo GrosserNancy J.. Armonii Sieh, and note scribed by Liz BeachSulibeya S. Dimas, CMA.  I have reviewed the document for accuracy and completeness and I agree with above. - Leo GrosserNancy J. Kenley Troop, MD   Lorie PhenixNancy Elianny Buxbaum, MD  [facility]

## 2015-09-09 ENCOUNTER — Telehealth: Payer: Self-pay

## 2015-09-09 LAB — CBC WITH DIFFERENTIAL/PLATELET
BASOS ABS: 0 10*3/uL (ref 0.0–0.2)
Basos: 1 %
EOS (ABSOLUTE): 0.1 10*3/uL (ref 0.0–0.4)
Eos: 2 %
HEMATOCRIT: 28.2 % — AB (ref 34.0–46.6)
HEMOGLOBIN: 8.9 g/dL — AB (ref 11.1–15.9)
Immature Grans (Abs): 0 10*3/uL (ref 0.0–0.1)
Immature Granulocytes: 0 %
LYMPHS ABS: 1.7 10*3/uL (ref 0.7–3.1)
Lymphs: 33 %
MCH: 26.4 pg — AB (ref 26.6–33.0)
MCHC: 31.6 g/dL (ref 31.5–35.7)
MCV: 84 fL (ref 79–97)
MONOCYTES: 16 %
Monocytes Absolute: 0.8 10*3/uL (ref 0.1–0.9)
NEUTROS ABS: 2.5 10*3/uL (ref 1.4–7.0)
NEUTROS PCT: 48 %
Platelets: 218 10*3/uL (ref 150–379)
RBC: 3.37 x10E6/uL — ABNORMAL LOW (ref 3.77–5.28)
RDW: 15.5 % — ABNORMAL HIGH (ref 12.3–15.4)
WBC: 5.2 10*3/uL (ref 3.4–10.8)

## 2015-09-09 LAB — COMPREHENSIVE METABOLIC PANEL
ALBUMIN: 4.1 g/dL (ref 3.5–4.7)
ALK PHOS: 54 IU/L (ref 39–117)
ALT: 7 IU/L (ref 0–32)
AST: 20 IU/L (ref 0–40)
Albumin/Globulin Ratio: 1.5 (ref 1.2–2.2)
BILIRUBIN TOTAL: 0.4 mg/dL (ref 0.0–1.2)
BUN / CREAT RATIO: 20 (ref 12–28)
BUN: 21 mg/dL (ref 8–27)
CHLORIDE: 101 mmol/L (ref 96–106)
CO2: 25 mmol/L (ref 18–29)
Calcium: 9.4 mg/dL (ref 8.7–10.3)
Creatinine, Ser: 1.04 mg/dL — ABNORMAL HIGH (ref 0.57–1.00)
GFR calc Af Amer: 55 mL/min/{1.73_m2} — ABNORMAL LOW (ref >59)
GFR calc non Af Amer: 48 mL/min/{1.73_m2} — ABNORMAL LOW (ref >59)
GLOBULIN, TOTAL: 2.8 g/dL (ref 1.5–4.5)
Glucose: 97 mg/dL (ref 65–99)
Potassium: 4.8 mmol/L (ref 3.5–5.2)
SODIUM: 141 mmol/L (ref 134–144)
Total Protein: 6.9 g/dL (ref 6.0–8.5)

## 2015-09-09 LAB — THYROID PANEL WITH TSH
FREE THYROXINE INDEX: 2.3 (ref 1.2–4.9)
T3 Uptake Ratio: 30 % (ref 24–39)
T4, Total: 7.8 ug/dL (ref 4.5–12.0)
TSH: 0.67 u[IU]/mL (ref 0.450–4.500)

## 2015-09-09 LAB — FERRITIN: FERRITIN: 8 ng/mL — AB (ref 15–150)

## 2015-09-09 LAB — IRON AND TIBC
IRON SATURATION: 5 % — AB (ref 15–55)
IRON: 22 ug/dL — AB (ref 27–139)
TIBC: 448 ug/dL (ref 250–450)
UIBC: 426 ug/dL — AB (ref 118–369)

## 2015-09-09 NOTE — Telephone Encounter (Signed)
Left message to call back  

## 2015-09-09 NOTE — Telephone Encounter (Signed)
-----   Message from Lorie PhenixNancy Maloney, MD sent at 09/09/2015  7:33 AM EDT ----- Labs stable except for anemia worse. Very low iron. Would restart FeSo4 daily and recheck in 4 weeks. Thanks.

## 2015-09-09 NOTE — Telephone Encounter (Signed)
Advised patient's daughter of results.  

## 2015-10-08 ENCOUNTER — Other Ambulatory Visit: Payer: Self-pay | Admitting: Family Medicine

## 2015-10-08 DIAGNOSIS — M549 Dorsalgia, unspecified: Secondary | ICD-10-CM

## 2015-10-08 MED ORDER — OXYCODONE-ACETAMINOPHEN 10-325 MG PO TABS: ORAL_TABLET | ORAL | Status: DC

## 2015-10-08 NOTE — Telephone Encounter (Signed)
Prescription printed. Please notify patient it is ready for pick up. Also make sure has appointment with new provider before nex refill . Thanks- Dr. Elease HashimotoMaloney.

## 2015-10-08 NOTE — Telephone Encounter (Signed)
Pt needs refill on her oxyCODONE-acetaminophen (PERCOCET) 10-325 MG tablet Taking 07/22/15 -- Lorie PhenixNancy Maloney, MD 1 tablet three times daily by mouth.  Please call when ready to be picked up   Thanks, teri

## 2015-12-25 ENCOUNTER — Ambulatory Visit: Payer: Self-pay | Admitting: Physician Assistant

## 2015-12-30 ENCOUNTER — Ambulatory Visit: Payer: Self-pay | Admitting: Physician Assistant

## 2016-01-14 ENCOUNTER — Ambulatory Visit: Payer: Self-pay | Admitting: Physician Assistant

## 2016-01-14 ENCOUNTER — Other Ambulatory Visit: Payer: Self-pay | Admitting: Physician Assistant

## 2016-01-14 DIAGNOSIS — M25561 Pain in right knee: Secondary | ICD-10-CM

## 2016-01-14 DIAGNOSIS — M25562 Pain in left knee: Secondary | ICD-10-CM

## 2016-01-14 DIAGNOSIS — W19XXXA Unspecified fall, initial encounter: Secondary | ICD-10-CM

## 2016-01-14 DIAGNOSIS — R2681 Unsteadiness on feet: Secondary | ICD-10-CM

## 2016-01-14 DIAGNOSIS — R600 Localized edema: Secondary | ICD-10-CM

## 2016-01-14 MED ORDER — TRAMADOL HCL 50 MG PO TABS: 50.0000 mg | ORAL_TABLET | Freq: Three times a day (TID) | ORAL | 0 refills | Status: DC | PRN

## 2016-01-14 MED ORDER — FUROSEMIDE 40 MG PO TABS: 40.0000 mg | ORAL_TABLET | Freq: Every day | ORAL | 5 refills | Status: AC

## 2016-01-14 NOTE — Progress Notes (Signed)
Spoke with daughter and patient has had 2 falls in the last 2 months. One fall occurred with her walker. Had significant leg swelling. Has furosemide 40mg  but does not take regularly. Daughter came to office today for consultation in place of her mother since her mother refused to come in. Per speaking with the daughter I question if the mother's depression is worsening and also question if she is scared she may fall in public. Will get physical therapy ordered for evaluation. Advised daughter to make mother sit with legs elevated. Needs to schedule another appt with me so that I can meet her and treat more appropriately.

## 2016-05-04 ENCOUNTER — Emergency Department: Payer: Medicare Other

## 2016-05-04 ENCOUNTER — Inpatient Hospital Stay: Payer: Medicare Other

## 2016-05-04 ENCOUNTER — Inpatient Hospital Stay
Admission: EM | Admit: 2016-05-04 | Discharge: 2016-05-10 | DRG: 811 | Disposition: A | Discharge status: Rehabilitation Facility | Payer: Medicare Other | Attending: Internal Medicine | Admitting: Internal Medicine

## 2016-05-04 ENCOUNTER — Encounter: Payer: Self-pay | Admitting: Medical Oncology

## 2016-05-04 DIAGNOSIS — I429 Cardiomyopathy, unspecified: Secondary | ICD-10-CM | POA: Diagnosis present

## 2016-05-04 DIAGNOSIS — G4700 Insomnia, unspecified: Secondary | ICD-10-CM | POA: Diagnosis present

## 2016-05-04 DIAGNOSIS — F0390 Unspecified dementia without behavioral disturbance: Secondary | ICD-10-CM | POA: Diagnosis present

## 2016-05-04 DIAGNOSIS — D509 Iron deficiency anemia, unspecified: Secondary | ICD-10-CM | POA: Diagnosis present

## 2016-05-04 DIAGNOSIS — F209 Schizophrenia, unspecified: Secondary | ICD-10-CM | POA: Diagnosis present

## 2016-05-04 DIAGNOSIS — M25561 Pain in right knee: Secondary | ICD-10-CM

## 2016-05-04 DIAGNOSIS — Z6827 Body mass index (BMI) 27.0-27.9, adult: Secondary | ICD-10-CM

## 2016-05-04 DIAGNOSIS — Z79899 Other long term (current) drug therapy: Secondary | ICD-10-CM

## 2016-05-04 DIAGNOSIS — J9601 Acute respiratory failure with hypoxia: Secondary | ICD-10-CM | POA: Diagnosis present

## 2016-05-04 DIAGNOSIS — I5043 Acute on chronic combined systolic (congestive) and diastolic (congestive) heart failure: Secondary | ICD-10-CM | POA: Diagnosis present

## 2016-05-04 DIAGNOSIS — E876 Hypokalemia: Secondary | ICD-10-CM | POA: Diagnosis present

## 2016-05-04 DIAGNOSIS — I248 Other forms of acute ischemic heart disease: Secondary | ICD-10-CM | POA: Diagnosis present

## 2016-05-04 DIAGNOSIS — Z66 Do not resuscitate: Secondary | ICD-10-CM | POA: Diagnosis present

## 2016-05-04 DIAGNOSIS — N183 Chronic kidney disease, stage 3 unspecified: Secondary | ICD-10-CM | POA: Diagnosis present

## 2016-05-04 DIAGNOSIS — I130 Hypertensive heart and chronic kidney disease with heart failure and stage 1 through stage 4 chronic kidney disease, or unspecified chronic kidney disease: Secondary | ICD-10-CM | POA: Diagnosis present

## 2016-05-04 DIAGNOSIS — I5041 Acute combined systolic (congestive) and diastolic (congestive) heart failure: Secondary | ICD-10-CM | POA: Diagnosis present

## 2016-05-04 DIAGNOSIS — Z87891 Personal history of nicotine dependence: Secondary | ICD-10-CM

## 2016-05-04 DIAGNOSIS — E46 Unspecified protein-calorie malnutrition: Secondary | ICD-10-CM | POA: Diagnosis present

## 2016-05-04 DIAGNOSIS — R0603 Acute respiratory distress: Secondary | ICD-10-CM

## 2016-05-04 DIAGNOSIS — F329 Major depressive disorder, single episode, unspecified: Secondary | ICD-10-CM | POA: Diagnosis present

## 2016-05-04 DIAGNOSIS — D631 Anemia in chronic kidney disease: Secondary | ICD-10-CM | POA: Diagnosis present

## 2016-05-04 DIAGNOSIS — M6281 Muscle weakness (generalized): Secondary | ICD-10-CM

## 2016-05-04 DIAGNOSIS — Z981 Arthrodesis status: Secondary | ICD-10-CM

## 2016-05-04 DIAGNOSIS — R0602 Shortness of breath: Secondary | ICD-10-CM

## 2016-05-04 DIAGNOSIS — Z23 Encounter for immunization: Secondary | ICD-10-CM

## 2016-05-04 DIAGNOSIS — J90 Pleural effusion, not elsewhere classified: Secondary | ICD-10-CM

## 2016-05-04 DIAGNOSIS — E871 Hypo-osmolality and hyponatremia: Secondary | ICD-10-CM | POA: Diagnosis present

## 2016-05-04 DIAGNOSIS — R2681 Unsteadiness on feet: Secondary | ICD-10-CM

## 2016-05-04 DIAGNOSIS — R609 Edema, unspecified: Secondary | ICD-10-CM

## 2016-05-04 DIAGNOSIS — R7989 Other specified abnormal findings of blood chemistry: Secondary | ICD-10-CM

## 2016-05-04 DIAGNOSIS — M25562 Pain in left knee: Secondary | ICD-10-CM

## 2016-05-04 DIAGNOSIS — I119 Hypertensive heart disease without heart failure: Secondary | ICD-10-CM | POA: Diagnosis present

## 2016-05-04 DIAGNOSIS — R778 Other specified abnormalities of plasma proteins: Secondary | ICD-10-CM

## 2016-05-04 HISTORY — DX: Iron deficiency anemia, unspecified: D50.9

## 2016-05-04 HISTORY — DX: Acute combined systolic (congestive) and diastolic (congestive) heart failure: I50.41

## 2016-05-04 HISTORY — DX: Depression, unspecified: F32.A

## 2016-05-04 HISTORY — DX: Unspecified protein-calorie malnutrition: E46

## 2016-05-04 HISTORY — DX: Chronic kidney disease, stage 3 unspecified: N18.30

## 2016-05-04 HISTORY — DX: Chronic kidney disease, stage 3 (moderate): N18.3

## 2016-05-04 HISTORY — DX: Morbid (severe) obesity due to excess calories: E66.01

## 2016-05-04 HISTORY — DX: Hypertensive heart disease without heart failure: I11.9

## 2016-05-04 HISTORY — DX: Major depressive disorder, single episode, unspecified: F32.9

## 2016-05-04 HISTORY — DX: Unspecified fall, initial encounter: W19.XXXA

## 2016-05-04 LAB — CBC WITH DIFFERENTIAL/PLATELET
Basophils Absolute: 0 10*3/uL (ref 0–0.1)
Basophils Relative: 0 %
EOS ABS: 0 10*3/uL (ref 0–0.7)
EOS PCT: 1 %
HCT: 22 % — ABNORMAL LOW (ref 35.0–47.0)
Hemoglobin: 7 g/dL — ABNORMAL LOW (ref 12.0–16.0)
LYMPHS ABS: 0.7 10*3/uL — AB (ref 1.0–3.6)
LYMPHS PCT: 14 %
MCH: 23.3 pg — AB (ref 26.0–34.0)
MCHC: 32 g/dL (ref 32.0–36.0)
MCV: 73 fL — ABNORMAL LOW (ref 80.0–100.0)
MONO ABS: 0.9 10*3/uL (ref 0.2–0.9)
Monocytes Relative: 16 %
Neutro Abs: 3.8 10*3/uL (ref 1.4–6.5)
Neutrophils Relative %: 69 %
PLATELETS: 232 10*3/uL (ref 150–440)
RBC: 3.02 MIL/uL — ABNORMAL LOW (ref 3.80–5.20)
RDW: 16 % — AB (ref 11.5–14.5)
WBC: 5.4 10*3/uL (ref 3.6–11.0)

## 2016-05-04 LAB — T4, FREE: FREE T4: 1.56 ng/dL — AB (ref 0.61–1.12)

## 2016-05-04 LAB — SODIUM
SODIUM: 126 mmol/L — AB (ref 135–145)
Sodium: 126 mmol/L — ABNORMAL LOW (ref 135–145)

## 2016-05-04 LAB — BASIC METABOLIC PANEL
Anion gap: 9 (ref 5–15)
BUN: 15 mg/dL (ref 6–20)
CALCIUM: 8.3 mg/dL — AB (ref 8.9–10.3)
CO2: 22 mmol/L (ref 22–32)
CREATININE: 0.87 mg/dL (ref 0.44–1.00)
Chloride: 95 mmol/L — ABNORMAL LOW (ref 101–111)
GFR calc Af Amer: >60 mL/min (ref >60)
GFR, EST NON AFRICAN AMERICAN: 57 mL/min — AB (ref >60)
Glucose, Bld: 101 mg/dL — ABNORMAL HIGH (ref 65–99)
POTASSIUM: 3.4 mmol/L — AB (ref 3.5–5.1)
Sodium: 126 mmol/L — ABNORMAL LOW (ref 135–145)

## 2016-05-04 LAB — URINALYSIS, ROUTINE W REFLEX MICROSCOPIC
Bilirubin Urine: NEGATIVE
GLUCOSE, UA: NEGATIVE mg/dL
Hgb urine dipstick: NEGATIVE
Ketones, ur: 5 mg/dL — AB
LEUKOCYTES UA: NEGATIVE
NITRITE: NEGATIVE
PH: 5 (ref 5.0–8.0)
PROTEIN: NEGATIVE mg/dL
Specific Gravity, Urine: 1.015 (ref 1.005–1.030)

## 2016-05-04 LAB — ABO/RH: ABO/RH(D): A POS

## 2016-05-04 LAB — TROPONIN I: TROPONIN I: 0.2 ng/mL — AB (ref <0.03)

## 2016-05-04 LAB — TSH: TSH: 0.701 u[IU]/mL (ref 0.350–4.500)

## 2016-05-04 LAB — PREPARE RBC (CROSSMATCH)

## 2016-05-04 MED ORDER — ONDANSETRON HCL 4 MG PO TABS: 4.0000 mg | ORAL_TABLET | Freq: Four times a day (QID) | ORAL | Status: DC | PRN

## 2016-05-04 MED ORDER — INFLUENZA VAC SPLIT QUAD 0.5 ML IM SUSY: 0.5000 mL | PREFILLED_SYRINGE | INTRAMUSCULAR | Status: DC

## 2016-05-04 MED ORDER — FUROSEMIDE 40 MG PO TABS
40.0000 mg | ORAL_TABLET | Freq: Every day | ORAL | Status: DC
  Administered 2016-05-04: 40 mg via ORAL
  Filled 2016-05-04: qty 1

## 2016-05-04 MED ORDER — SODIUM CHLORIDE 0.9 % IV BOLUS (SEPSIS)
500.0000 mL | Freq: Once | INTRAVENOUS | Status: AC
  Administered 2016-05-04: 500 mL via INTRAVENOUS

## 2016-05-04 MED ORDER — ACETAMINOPHEN 325 MG PO TABS
650.0000 mg | ORAL_TABLET | Freq: Four times a day (QID) | ORAL | Status: DC | PRN
  Administered 2016-05-08: 650 mg via ORAL
  Filled 2016-05-04: qty 2

## 2016-05-04 MED ORDER — ONDANSETRON HCL 4 MG/2ML IJ SOLN: 4.0000 mg | Freq: Four times a day (QID) | INTRAMUSCULAR | Status: DC | PRN

## 2016-05-04 MED ORDER — MORPHINE SULFATE (PF) 2 MG/ML IV SOLN
2.0000 mg | Freq: Once | INTRAVENOUS | Status: AC
  Administered 2016-05-04: 2 mg via INTRAVENOUS
  Filled 2016-05-04: qty 1

## 2016-05-04 MED ORDER — SODIUM CHLORIDE 0.9 % IV SOLN
INTRAVENOUS | Status: DC
  Administered 2016-05-04: 15:00:00 via INTRAVENOUS

## 2016-05-04 MED ORDER — HYDRALAZINE HCL 20 MG/ML IJ SOLN: 10.0000 mg | Freq: Four times a day (QID) | INTRAMUSCULAR | Status: DC | PRN

## 2016-05-04 MED ORDER — FERROUS SULFATE 325 (65 FE) MG PO TABS
325.0000 mg | ORAL_TABLET | Freq: Two times a day (BID) | ORAL | Status: DC
  Administered 2016-05-04 – 2016-05-10 (×12): 325 mg via ORAL
  Filled 2016-05-04 (×12): qty 1

## 2016-05-04 MED ORDER — SODIUM CHLORIDE 0.9 % IV SOLN
10.0000 mL/h | Freq: Once | INTRAVENOUS | Status: AC
  Administered 2016-05-04: 10 mL/h via INTRAVENOUS

## 2016-05-04 MED ORDER — TRAMADOL HCL 50 MG PO TABS: 50.0000 mg | ORAL_TABLET | Freq: Four times a day (QID) | ORAL | Status: DC | PRN

## 2016-05-04 MED ORDER — SENNOSIDES-DOCUSATE SODIUM 8.6-50 MG PO TABS: 1.0000 | ORAL_TABLET | Freq: Every evening | ORAL | Status: DC | PRN

## 2016-05-04 MED ORDER — OXYCODONE-ACETAMINOPHEN 5-325 MG PO TABS
1.0000 | ORAL_TABLET | Freq: Four times a day (QID) | ORAL | Status: DC | PRN
  Administered 2016-05-04 – 2016-05-07 (×4): 1 via ORAL
  Filled 2016-05-04 (×5): qty 1

## 2016-05-04 MED ORDER — CITALOPRAM HYDROBROMIDE 20 MG PO TABS
20.0000 mg | ORAL_TABLET | Freq: Every day | ORAL | Status: DC
  Administered 2016-05-05 – 2016-05-10 (×6): 20 mg via ORAL
  Filled 2016-05-04 (×6): qty 1

## 2016-05-04 MED ORDER — BISACODYL 5 MG PO TBEC
5.0000 mg | DELAYED_RELEASE_TABLET | Freq: Every day | ORAL | Status: DC | PRN
  Administered 2016-05-10: 5 mg via ORAL
  Filled 2016-05-04: qty 1

## 2016-05-04 MED ORDER — OXYCODONE-ACETAMINOPHEN 10-325 MG PO TABS: 1.0000 | ORAL_TABLET | Freq: Four times a day (QID) | ORAL | Status: DC | PRN

## 2016-05-04 MED ORDER — OXYCODONE HCL 5 MG PO TABS
5.0000 mg | ORAL_TABLET | Freq: Four times a day (QID) | ORAL | Status: DC | PRN
  Administered 2016-05-07 – 2016-05-08 (×2): 5 mg via ORAL
  Filled 2016-05-04 (×2): qty 1

## 2016-05-04 MED ORDER — ACETAMINOPHEN 650 MG RE SUPP: 650.0000 mg | Freq: Four times a day (QID) | RECTAL | Status: DC | PRN

## 2016-05-04 NOTE — Progress Notes (Signed)
Patient BP 168/91. MD notified and will put in orders.  Harvie HeckMelanie Christalyn Goertz, RN

## 2016-05-04 NOTE — ED Triage Notes (Signed)
Pt from home via ems with reports of slipping and falling out of bed this am. Pt denies hitting head. No use of blood thinner.

## 2016-05-04 NOTE — Progress Notes (Signed)
Admission: Patient alert and oriented X4. Lung sounds diminished heart sounds normal. Scattered bruising on skin, daughter said due to falls at home. Patient lives alone. No broken bones were found. Patient complaining of pain of the left shoulder and the right elbow. Said she fell at home getting out the bed. Patient receiving 1 unit of blood. Patient not having any reactions at this time. Daughter is POA and at bedside. Patient is a 2 assist to bedside commode.  Harvie HeckMelanie Tressie Ragin, RN

## 2016-05-04 NOTE — ED Provider Notes (Signed)
Time Seen: Approximately 1156  I have reviewed the triage notes  Chief Complaint: Fall; Arm Pain; and Shoulder Injury   History of Present Illness: Courtney Wright is a 83 y.o. female who states that she had a non-syncopal fall this morning. She was trying to get out of bed and apparently her legs slipped out from underneath her and she was not wearing any slippers etc. Patient fell primarily landing on her buttock area and was found by her daughter sitting upright on the floor. She denies any head trauma or loss of consciousness. Her daughter goes on to state that she has been not "" feeling well "" the last couple of days. The daughter cannot be more specific. She describes a decreased appetite, no focal weakness or fever. Her main concerns are some left shoulder pain, right elbow discomfort, right hip pain   Past Medical History:  Diagnosis Date  . CKD (chronic kidney disease)   . Hypertension     Patient Active Problem List   Diagnosis Date Noted  . Subclinical hyperthyroidism 11/25/2014  . Anemia associated with chronic renal failure 11/11/2014  . Anxiety 11/11/2014  . Chest pain 11/11/2014  . Chronic kidney disease 11/11/2014  . CN (constipation) 11/11/2014  . Cough 11/11/2014  . Accumulation of fluid in tissues 11/11/2014  . BP (high blood pressure) 11/11/2014  . Normocytic anemia 11/11/2014  . Breath shortness 11/11/2014  . Convulsions (HCC) 11/11/2014  . Back pain 11/11/2014  . DJD (degenerative joint disease) 11/04/2014    Past Surgical History:  Procedure Laterality Date  . ANKLE SURGERY Left   . History of Cataract surgery  2008  . History of cervical discectomy  01/10/2008   Dr. Lovell Sheehan, [facility]; Fusion and Plating at C4-5 and 5-6    Past Surgical History:  Procedure Laterality Date  . ANKLE SURGERY Left   . History of Cataract surgery  2008  . History of cervical discectomy  01/10/2008   Dr. Lovell Sheehan, [facility]; Fusion and Plating at C4-5 and 5-6     Current Outpatient Rx  . Order #: [id number] Class: Normal  . Order #: [id number] Class: Normal  . Order #: [id number] Class: Normal  . Order #: [id number] Class: Print  . Order #: [id number] Class: Print    Allergies:  Anaprox  [naproxen] and Fenoprofen calcium  Family History: Family History  Problem Relation Age of Onset  . Dementia Mother   . Alzheimer's disease Mother   . Cerebrovascular Accident Father   . Congestive Heart Failure Father   . Rheumatic fever Father   . Heart disease Sister   . Arthritis Sister   . Diabetes Brother     borderline diabetes  . Cancer Brother     Laryngeal Cancer and breast    Social History: Social History  Substance Use Topics  . Smoking status: Former Smoker    Quit date: 04/18/1969  . Smokeless tobacco: Never Used  . Alcohol use 0.0 oz/week     Comment: ocassional     Review of Systems:   10 point review of systems was performed and was otherwise negative:  Constitutional: No fever Eyes: No visual disturbances ENT: No sore throat, ear pain Cardiac: No chest pain Respiratory: No shortness of breath, wheezing, or stridor Abdomen: No abdominal pain, no vomiting, No diarrhea Endocrine: No weight loss, No night sweats Extremities: No peripheral edema, cyanosis Skin: No rashes, easy bruising Neurologic: No focal weakness, trouble with speech or swollowing Urologic: No dysuria, Hematuria, or urinary frequency  Physical Exam:  ED Triage Vitals  Enc Vitals Group     BP 05/04/16 1118 (!) 172/80     Pulse Rate 05/04/16 1118 80     Resp 05/04/16 1118 20     Temp 05/04/16 1118 97.3 F (36.3 C)     Temp Source 05/04/16 1118 Oral     SpO2 05/04/16 1118 96 %     Weight 05/04/16 1120 160 lb (72.6 kg)     Height 05/04/16 1120 5\' 4"  (1.626 m)     Head Circumference --      Peak Flow --      Pain Score 05/04/16 1120 10     Pain Loc --      Pain Edu? --      Excl. in GC? --     General: Awake , Alert , and Oriented times 3;  GCS 15 Head: Normal cephalic , atraumatic Eyes: Pupils equal , round, reactive to light Nose/Throat: No nasal drainage, patent upper airway without erythema or exudate.  Neck: Supple, Full range of motion, No anterior adenopathy or palpable thyroid masses Lungs: Clear to ascultation without wheezes , rhonchi, or rales Heart: Regular rate, regular rhythm without murmurs , gallops , or rubs Abdomen: Soft, non tender without rebound, guarding , or rigidity; bowel sounds positive and symmetric in all 4 quadrants. No organomegaly .        Extremities: No obvious shortening of either lower extremity. No obvious rotation. Mainly pain with hip flexion on the right. The lower extremities are otherwise neurovascularly intact. Close examination of both upper extremities show mainly left-sided shoulder pain with crepitus. She has good supination and pronation the left elbow and no obvious lower musculoskeletal abnormalities. Right elbow has a contusion over the lateral surface of the elbow. She again has good supination and pronation in the upper extremities are overall neurovascularly intact  Neurologic: n, Motor symmetric without deficits, sensory intact Skin: warm, dry, no rashes Rectal exam: Guaiac negative with normal sphincter tone  Labs:   All laboratory work was reviewed including any pertinent negatives or positives listed below:  Labs Reviewed  CBC WITH DIFFERENTIAL/PLATELET - Abnormal; Notable for the following:       Result Value   RBC 3.02 (*)    Hemoglobin 7.0 (*)    HCT 22.0 (*)    MCV 73.0 (*)    MCH 23.3 (*)    RDW 16.0 (*)    Lymphs Abs 0.7 (*)    All other components within normal limits  BASIC METABOLIC PANEL    EKG: * ED ECG REPORT I, Jennye Moccasin, the attending physician, personally viewed and interpreted this ECG.  Date: 05/04/2016 EKG Time: 1311 Rate: 76Rhythm: normal sinus rhythm QRS Axis: normal Intervals: normal ST/T Wave abnormalities: Nonspecific ST-T wave  abnormalities without any acute ischemic changes Conduction Disturbances: none Narrative Interpretation: unremarkable    Radiology:   "Dg Elbow 2 Views Right  Result Date: 05/04/2016 CLINICAL DATA:  Left elbow pain due to a fall this morning. Initial encounter. EXAM: RIGHT ELBOW - 2 VIEW COMPARISON:  None. FINDINGS: No fracture, dislocation or joint effusion is identified. Soft tissues posterior to the elbow appear mildly swollen. Scattered subcutaneous calcifications posteriorly may be due to phleboliths. IMPRESSION: Negative for fracture dislocation. Possible posterior soft tissue swelling may be due to hematoma. Electronically Signed   By: Drusilla Kanner M.D.   On: 05/04/2016 12:35   Dg Shoulder Left  Result Date: 05/04/2016 CLINICAL DATA:  Fall  this morning. Left shoulder pain. Initial encounter. EXAM: LEFT SHOULDER - 2+ VIEW COMPARISON:  None. FINDINGS: There is no evidence of fracture or dislocation. Generalized osteopenia noted. High-riding humeral head seen, consistent with chronic rotator cuff tear or atrophy. IMPRESSION: No acute findings. Findings consistent with chronic rotator cuff tear or atrophy. Electronically Signed   By: Myles RosenthalJohn  Stahl M.D.   On: 05/04/2016 12:32   Dg Hip Unilat W Or Wo Pelvis 2-3 Views Left  Result Date: 05/04/2016 CLINICAL DATA:  Left hip pain due to a fall this morning. Initial encounter. EXAM: DG HIP (WITH OR WITHOUT PELVIS) 2-3V LEFT COMPARISON:  None. FINDINGS: No acute bony or joint abnormality is identified. No notable degenerative change about the hips. No focal bony lesion. Degenerative change symphysis pubis somewhat lumbar spine noted. Soft tissues are unremarkable. IMPRESSION: No acute abnormality. Electronically Signed   By: Drusilla Kannerhomas  Dalessio M.D.   On: 05/04/2016 12:31   Dg Hip Unilat W Or Wo Pelvis 2-3 Views Right  Result Date: 05/04/2016 CLINICAL DATA:  Bilateral hip pain due to a fall this morning. EXAM: DG HIP (WITH OR WITHOUT PELVIS) 2-3V  RIGHT COMPARISON:  None. FINDINGS: No acute bony or joint abnormality is identified. No notable degenerative change about the hip. No focal bony lesion. Mild degenerative change of the symphysis pubis and lower lumbar spondylosis noted. IMPRESSION: No acute abnormality. Electronically Signed   By: Drusilla Kannerhomas  Dalessio M.D.   On: 05/04/2016 12:31  "    I personally reviewed the radiologic studies   Procedures:  Patient will have a left arm sling applied for comfort.    ED Course: The patient most likely suffers from iron deficiency anemia as she was guaiac-negative on rectal exam and per her past history and is been noncompliant with her iron supplement. She also has some hyponatremia was started on IV fluid resuscitation. She was IV morphine for pain and doesn't appear to have any obvious bony injuries per x-ray evaluation. The patient will have an attempt at the bedside to bear weight to make sure her right hip is clear. Patient be initiated on blood transfusion for hemoglobin of 7 and symptomatic Clinical Course      Assessment: Status post non-syncopal fall with multiple contusions Anemia Hyponatremia      Plan: * Inpatient management            Jennye MoccasinBrian S Yilin Weedon, MD 05/04/16 1359

## 2016-05-04 NOTE — Progress Notes (Addendum)
CRITICAL VALUE ALERT  Critical value received: Troponin 0.20  Date of notification:  05/04/2016  Time of notification:  1600  Critical value read back:Yes.    Nurse who received alert:  Harvie HeckMelanie Taila Basinski RN  MD notified (1st page):  Mody  Time of first page:  1605  Responding MD:  Juliene PinaMody   No new orders at this time.   Harvie HeckMelanie Karmon Andis, RN

## 2016-05-04 NOTE — Progress Notes (Signed)
Family Meeting Note  Advance Directive:yes  Today a meeting took place with the Patient.and daughter POA    The following clinical team members were present during this meeting:MD  The following were discussed:Patient's diagnosis: fall weakness Hyponatremia Anemia  Patient's progosis: Unable to determine and Goals for treatment: DNR  Additional follow-up to be provided: outpatient Palliatvie care consult at discharge  Time spent during discussion:16 minutes  Prashant Glosser, Patricia PesaSITAL, MD

## 2016-05-04 NOTE — H&P (Signed)
[facility] -  at [facility]   PATIENT NAME: Courtney Wright    MR#:  [id number]  DATE OF BIRTH:  01-15-1925  DATE OF ADMISSION:  05/04/2016  PRIMARY CARE PHYSICIAN: Lorie Phenix, MD   REQUESTING/REFERRING PHYSICIAN:  Dr Huel Cote   CHIEF COMPLAINT:   Fall weakness HISTORY OF PRESENT ILLNESS:  Courtney Wright  is a 83 y.o. female with a known history of Normocytic anemia on iron supplementation and essential hypertension who presents due to a mechanical fall. Patient reports that she was getting out of bed when her legs gave out and he fell. She did not hit her head however did fall on her hips and landed on her left shoulder. X-rays performed in the emergency room showed no acute hip fracture or elbow/ humerus fracture. She is found to have a hemoglobin of 7.0. Patient denies dark colored stools. She is guaiac negative. Patient's daughter reports over the past several days patient has had decreased oral intake and generalized weakness.  PAST MEDICAL HISTORY:   Past Medical History:  Diagnosis Date  . Normocytic anemia    . Hypertension   Subclinical hyperthyroidism  PAST SURGICAL HISTORY:   Past Surgical History:  Procedure Laterality Date  . ANKLE SURGERY Left   . History of Cataract surgery  2008  . History of cervical discectomy  01/10/2008   Dr. Lovell [facility], [facility]; Fusion and Plating at C4-5 and 5-6    SOCIAL HISTORY:   Social History  Substance Use Topics  . Smoking status: Former Smoker    Quit date: 04/18/1969  . Smokeless tobacco: Never Used  . Alcohol use 0.0 oz/week     Comment: ocassional    FAMILY HISTORY:   Family History  Problem Relation Age of Onset  . Dementia Mother   . Alzheimer's disease Mother   . Cerebrovascular Accident Father   . Congestive Heart Failure Father   . Rheumatic fever Father   . Heart disease Sister   . Arthritis Sister   . Diabetes Brother     borderline diabetes  . Cancer Brother     Laryngeal  Cancer and breast    DRUG ALLERGIES:   Allergies  Allergen Reactions  . Anaprox  [Naproxen]   . Fenoprofen Calcium     REVIEW OF SYSTEMS:   Review of Systems  Constitutional: Positive for malaise/fatigue. Negative for chills and fever.  HENT: Negative.  Negative for ear discharge, ear pain, hearing loss, nosebleeds and sore throat.   Eyes: Negative.  Negative for blurred vision and pain.  Respiratory: Negative.  Negative for cough, hemoptysis, shortness of breath and wheezing.   Cardiovascular: Negative.  Negative for chest pain, palpitations and leg swelling.  Gastrointestinal: Negative.  Negative for abdominal pain, blood in stool, diarrhea, nausea and vomiting.  Genitourinary: Negative.  Negative for dysuria.  Musculoskeletal: Positive for falls and joint pain. Negative for back pain.  Skin: Negative.   Neurological: Positive for weakness. Negative for dizziness, tremors, speech change, focal weakness, seizures and headaches.  Endo/Heme/Allergies: Negative.  Does not bruise/bleed easily.  Psychiatric/Behavioral: Negative.  Negative for depression, hallucinations and suicidal ideas.    MEDICATIONS AT HOME:   Prior to Admission medications   Medication Sig Start Date End Date Taking? Authorizing Provider  citalopram (CELEXA) 20 MG tablet TAKE ONE (1) TABLET BY MOUTH EVERY DAY 06/26/15   Lorie Phenix, MD  diclofenac sodium (VOLTAREN) 1 % GEL Apply 2 g topically 4 (four) times daily. Reported on 09/08/2015 09/08/15  Lorie PhenixNancy Maloney, MD  furosemide (LASIX) 40 MG tablet Take 1 tablet (40 mg total) by mouth daily. 01/14/16   Margaretann LovelessJennifer M Burnette, PA-C  oxyCODONE-acetaminophen (PERCOCET) 10-325 MG tablet 1 tablet three times daily by mouth. 10/08/15   Lorie PhenixNancy Maloney, MD  traMADol (ULTRAM) 50 MG tablet Take 1 tablet (50 mg total) by mouth every 8 (eight) hours as needed. 01/14/16   Margaretann LovelessJennifer M Burnette, PA-C      VITAL SIGNS:  Blood pressure (!) 172/80, pulse 80, temperature 97.3 F (36.3  C), temperature source Oral, resp. rate 20, height 5\' 4"  (1.626 m), weight 72.6 kg (160 lb), SpO2 96 %.  PHYSICAL EXAMINATION:   Physical Exam  Constitutional: She is oriented to person, place, and time and well-developed, well-nourished, and in no distress. No distress.  HENT:  Head: Normocephalic.  Eyes: No scleral icterus.  Neck: Normal range of motion. Neck supple. No JVD present. No tracheal deviation present.  Cardiovascular: Normal rate, regular rhythm and normal heart sounds.  Exam reveals no gallop and no friction rub.   No murmur heard. Pulmonary/Chest: Effort normal and breath sounds normal. No respiratory distress. She has no wheezes. She has no rales. She exhibits no tenderness.  Abdominal: Soft. Bowel sounds are normal. She exhibits no distension and no mass. There is no tenderness. There is no rebound and no guarding.  Musculoskeletal: She exhibits edema (Right LEE).  Decreased ROM left shoulder and elbow with swelling  Neurological: She is alert and oriented to person, place, and time.  Skin: Skin is warm. No rash noted. No erythema.  Psychiatric: Affect and judgment normal.      LABORATORY PANEL:   CBC  Recent Labs Lab 05/04/16 1132  WBC 5.4  HGB 7.0*  HCT 22.0*  PLT 232   ------------------------------------------------------------------------------------------------------------------  Chemistries   Recent Labs Lab 05/04/16 1132  NA 126*  K 3.4*  CL 95*  CO2 22  GLUCOSE 101*  BUN 15  CREATININE 0.87  CALCIUM 8.3*   ------------------------------------------------------------------------------------------------------------------  Cardiac Enzymes No results for input(s): TROPONINI in the last 168 hours. ------------------------------------------------------------------------------------------------------------------  RADIOLOGY:  Dg Elbow 2 Views Right  Result Date: 05/04/2016 CLINICAL DATA:  Left elbow pain due to a fall this morning.  Initial encounter. EXAM: RIGHT ELBOW - 2 VIEW COMPARISON:  None. FINDINGS: No fracture, dislocation or joint effusion is identified. Soft tissues posterior to the elbow appear mildly swollen. Scattered subcutaneous calcifications posteriorly may be due to phleboliths. IMPRESSION: Negative for fracture dislocation. Possible posterior soft tissue swelling may be due to hematoma. Electronically Signed   By: Drusilla Kannerhomas  Dalessio M.D.   On: 05/04/2016 12:35   Dg Shoulder Left  Result Date: 05/04/2016 CLINICAL DATA:  Fall this morning. Left shoulder pain. Initial encounter. EXAM: LEFT SHOULDER - 2+ VIEW COMPARISON:  None. FINDINGS: There is no evidence of fracture or dislocation. Generalized osteopenia noted. High-riding humeral head seen, consistent with chronic rotator cuff tear or atrophy. IMPRESSION: No acute findings. Findings consistent with chronic rotator cuff tear or atrophy. Electronically Signed   By: Myles RosenthalJohn  Stahl M.D.   On: 05/04/2016 12:32   Dg Hip Unilat W Or Wo Pelvis 2-3 Views Left  Result Date: 05/04/2016 CLINICAL DATA:  Left hip pain due to a fall this morning. Initial encounter. EXAM: DG HIP (WITH OR WITHOUT PELVIS) 2-3V LEFT COMPARISON:  None. FINDINGS: No acute bony or joint abnormality is identified. No notable degenerative change about the hips. No focal bony lesion. Degenerative change symphysis pubis somewhat lumbar spine noted. Soft  tissues are unremarkable. IMPRESSION: No acute abnormality. Electronically Signed   By: Drusilla Kanner M.D.   On: 05/04/2016 12:31   Dg Hip Unilat W Or Wo Pelvis 2-3 Views Right  Result Date: 05/04/2016 CLINICAL DATA:  Bilateral hip pain due to a fall this morning. EXAM: DG HIP (WITH OR WITHOUT PELVIS) 2-3V RIGHT COMPARISON:  None. FINDINGS: No acute bony or joint abnormality is identified. No notable degenerative change about the hip. No focal bony lesion. Mild degenerative change of the symphysis pubis and lower lumbar spondylosis noted. IMPRESSION: No  acute abnormality. Electronically Signed   By: Drusilla Kanner M.D.   On: 05/04/2016 12:31    EKG:     IMPRESSION AND PLAN:   83 year old female with a history of normocytic anemia who presents with weakness, poor by mouth intake over the past week and a mechanical fall.  1. Acute anemia on chronic anemia: Patient will receive 1 unit of PRBCs She is guaiac negative Ferrous sulfate started CBC in a.m. Stop NSAIDs   2. Hyponatremia due to poor by mouth intake Start IV fluids Monitor sodium level every 6 hours Check TSH with T3 and T4 due to history of subclinical hyperthyroidism  3 Depression on Celexa  4. Right lower extremity edema: Evaluate for DVT with Doppler ultrasound   10. Weakness with falls and shoulder pain: PT and OT consult No fractures seen on x-rays. All the records are reviewed and case discussed with ED provider. Management plans discussed with the patient and she in agreement  CODE STATUS: DNR  TOTAL TIME TAKING CARE OF THIS PATIENT: 35 minutes.    Francisca Langenderfer M.D on 05/04/2016 at 1:12 PM  Between 7am to 6pm - Pager - [phone]  After 6pm go to [email or web address] - Social research officer, government  [facility]  Office  [phone]  CC: Primary care physician; Lorie Phenix, MD

## 2016-05-05 ENCOUNTER — Inpatient Hospital Stay: Payer: Medicare Other

## 2016-05-05 ENCOUNTER — Inpatient Hospital Stay
Admit: 2016-05-05 | Discharge: 2016-05-05 | Disposition: A | Discharge status: Home / Self Care | Payer: Medicare Other | Attending: Internal Medicine | Admitting: Internal Medicine

## 2016-05-05 LAB — CBC
HCT: 26.3 % — ABNORMAL LOW (ref 35.0–47.0)
HEMOGLOBIN: 8.5 g/dL — AB (ref 12.0–16.0)
MCH: 24.2 pg — ABNORMAL LOW (ref 26.0–34.0)
MCHC: 32.2 g/dL (ref 32.0–36.0)
MCV: 75.1 fL — AB (ref 80.0–100.0)
PLATELETS: 256 10*3/uL (ref 150–440)
RBC: 3.51 MIL/uL — AB (ref 3.80–5.20)
RDW: 16.3 % — ABNORMAL HIGH (ref 11.5–14.5)
WBC: 8.4 10*3/uL (ref 3.6–11.0)

## 2016-05-05 LAB — URINE CULTURE

## 2016-05-05 LAB — BASIC METABOLIC PANEL
ANION GAP: 10 (ref 5–15)
BUN: 13 mg/dL (ref 6–20)
CHLORIDE: 93 mmol/L — AB (ref 101–111)
CO2: 23 mmol/L (ref 22–32)
CREATININE: 0.9 mg/dL (ref 0.44–1.00)
Calcium: 8.3 mg/dL — ABNORMAL LOW (ref 8.9–10.3)
GFR calc Af Amer: >60 mL/min (ref >60)
GFR calc non Af Amer: 55 mL/min — ABNORMAL LOW (ref >60)
Glucose, Bld: 100 mg/dL — ABNORMAL HIGH (ref 65–99)
Potassium: 3.6 mmol/L (ref 3.5–5.1)
SODIUM: 126 mmol/L — AB (ref 135–145)

## 2016-05-05 LAB — SODIUM
SODIUM: 127 mmol/L — AB (ref 135–145)
SODIUM: 127 mmol/L — AB (ref 135–145)
Sodium: 127 mmol/L — ABNORMAL LOW (ref 135–145)

## 2016-05-05 LAB — TROPONIN I: TROPONIN I: 0.16 ng/mL — AB (ref <0.03)

## 2016-05-05 LAB — T3: T3, Total: 84 ng/dL (ref 71–180)

## 2016-05-05 MED ORDER — ENOXAPARIN SODIUM 40 MG/0.4ML ~~LOC~~ SOLN
40.0000 mg | SUBCUTANEOUS | Status: DC
  Administered 2016-05-05 – 2016-05-08 (×4): 40 mg via SUBCUTANEOUS
  Filled 2016-05-05 (×3): qty 0.4

## 2016-05-05 MED ORDER — ASPIRIN EC 81 MG PO TBEC
81.0000 mg | DELAYED_RELEASE_TABLET | Freq: Every day | ORAL | Status: DC
  Administered 2016-05-05 – 2016-05-08 (×4): 81 mg via ORAL
  Filled 2016-05-05 (×4): qty 1

## 2016-05-05 MED ORDER — INFLUENZA VAC SPLIT QUAD 0.5 ML IM SUSY
0.5000 mL | PREFILLED_SYRINGE | INTRAMUSCULAR | Status: AC | PRN
  Administered 2016-05-10: 0.5 mL via INTRAMUSCULAR
  Filled 2016-05-05: qty 0.5

## 2016-05-05 MED ORDER — LISINOPRIL 5 MG PO TABS
5.0000 mg | ORAL_TABLET | Freq: Every day | ORAL | Status: DC
  Administered 2016-05-05 – 2016-05-09 (×5): 5 mg via ORAL
  Filled 2016-05-05 (×5): qty 1

## 2016-05-05 MED ORDER — LORAZEPAM 2 MG/ML IJ SOLN: 0.5000 mg | INTRAMUSCULAR | Status: DC | PRN

## 2016-05-05 MED ORDER — FUROSEMIDE 10 MG/ML IJ SOLN
40.0000 mg | Freq: Two times a day (BID) | INTRAMUSCULAR | Status: DC
  Administered 2016-05-05 – 2016-05-07 (×5): 40 mg via INTRAVENOUS
  Filled 2016-05-05 (×5): qty 4

## 2016-05-05 NOTE — Progress Notes (Signed)
OT Cancellation Note  Patient Details Name: Courtney Wright MRN: [id number] DOB: 05/21/1924   Cancelled Treatment:    Reason Eval/Treat Not Completed: Medical issues which prohibited therapy. Order received, chart reviewed. Troponin levels elevated, withholding OT evaluation at this time. Will continue to monitor and see pt at later date/time when medically appropriate.  Eliezer BottomJamie L Stiller, OTR/L 05/05/2016, 9:10 AM

## 2016-05-05 NOTE — Care Management Note (Signed)
Case Management Note  Patient Details  Name: DESTA BUJAK MRN: [id number] Date of Birth: 1925/01/18  Subjective/Objective:                  Met with patient to discuss discharge planning. She is from home alone. Her husband is at [facility]. Her daughter provides transportation. She does not have a PCP but states she has been meaning to call [facility]  [phone]. Her PCP was Dr. Venia Minks. She uses [facility], [facility]. She denies having any problems paying for Rx. She has a front-wheeled walker available for use in the home. PT evaluation pending.   Action/Plan: RNCM will call [facility] to arrange follow up appointment.    Expected Discharge Date:                  Expected Discharge Plan:     In-House Referral:  PCP / [facility]  Discharge planning Services  CM Consult  Post Acute Care Choice:  Home Health Choice offered to:  Patient  DME Arranged:    DME Agency:     HH Arranged:    [place] Agency:     Status of Service:  In process, will continue to follow  If discussed at Long Length of Stay Meetings, dates discussed:    Additional Comments:  Marshell Garfinkel, RN 05/05/2016, 9:21 AM

## 2016-05-05 NOTE — Clinical Social Work Note (Signed)
Clinical Social Work Assessment  Patient Details  Name: Courtney GreavesBettie R Wright MRN: [id number] Date of Birth: 05/21/1924  Date of referral:  05/05/16               Reason for consult:  Facility Placement                Permission sought to share information with:  Oceanographeracility Contact Representative Permission granted to share information::  Yes, Verbal Permission Granted  Name::      Skilled Nursing Facility   Agency::   [facility]   Relationship::     Contact Information:     Housing/Transportation Living arrangements for the past 2 months:  Single Family Home Source of Information:    Patient Interpreter Needed:  None Criminal Activity/Legal Involvement Pertinent to Current Situation/Hospitalization:  No - Comment as needed Significant Relationships:  Adult Children Lives with:  Adult Children Do you feel safe going back to the place where you live?    Need for family participation in patient care:  Yes (Comment)  Care giving concerns:  Patient lives alone in [place].    Social Worker assessment / plan:  Visual merchandiserClinical Social Worker (CSW) reviewed chart and noted that PT is recommending SNF. CSW contacted patient's daughter Courtney Wright. Per daughter patient lives alone in [place]. [place] in [place] and comes to see patient weekly. Courtney Wright reported that patient's other daughter lives in [place]. CSW explained to Courtney Wright that PT is recommending SNF. Per daughter patient has been to [place] several years ago. CSW explained that medicare requires a 3 night qualifying inpatient stay in order to pay for SNF. Patient was admitted to inpatient on 05/04/16. Daughter verbalized her understanding and is agreeable to SNF search in [place].   Employment status:  Retired Health and safety inspectornsurance information:  Medicare PT Recommendations:  Skilled Nursing Facility Information / Referral to community resources:  Skilled Nursing Facility  Patient/Family's Response to care:  Daughter is agreeable to [place]  search.   Patient/Family's Understanding of and Emotional Response to Diagnosis, Current Treatment, and Prognosis:  Daughter was very pleasant and thanked CSW for assistance.   Emotional Assessment Appearance:  Appears stated age Attitude/Demeanor/Rapport:    Affect (typically observed):  Unable to Assess Orientation:  Oriented to Self, Oriented to Place, Oriented to  Time, Oriented to Situation Alcohol / Substance use:  Not Applicable Psych involvement (Current and /or in the community):  No (Comment)  Discharge Needs  Concerns to be addressed:  Discharge Planning Concerns Readmission within the last 30 days:  No Current discharge risk:  Dependent with Mobility Barriers to Discharge:  Continued Medical Work up   [employer], Courtney CrockerBailey M, LCSW 05/05/2016, 5:14 PM

## 2016-05-05 NOTE — Progress Notes (Signed)
[facility]  ROUNDING NOTE   Subjective:   Ms. Jeronimo GreavesBettie R Schweiger was admitted to [facility] on 05/04/2016 for Iron deficiency anemia, unspecified iron deficiency anemia type [D50.9]   Patient fell.   Consulted for hyponatremia.   Objective:  Vital signs in last 24 hours:  Temp:  [97.3 F (36.3 C)-98.3 F (36.8 C)] 98.3 F (36.8 C) (01/17 0810) Pulse Rate:  [71-89] 78 (01/17 0810) Resp:  [17-22] 18 (01/16 1933) BP: (162-180)/(71-91) 162/71 (01/17 0810) SpO2:  [93 %-97 %] 96 % (01/17 0810) Weight:  [72.6 kg (160 lb)] 72.6 kg (160 lb) (01/16 1120)  Weight change:  Filed Weights   05/04/16 1120  Weight: 72.6 kg (160 lb)    Intake/Output: I/O last 3 completed shifts: In: 1615.4 [P.O.:720; I.V.:19.4; Blood:376; IV Piggyback:500] Out: 1400 [Urine:1400]   Intake/Output this shift:  Total I/O In: -  Out: 125 [Urine:125]  Physical Exam: General: NAD, sitting up in chair  Head: Normocephalic, atraumatic. Moist oral mucosal membranes  Eyes: Anicteric, PERRL  Neck: Supple, trachea midline  Lungs:  Clear to auscultation  Heart: Regular rate and rhythm  Abdomen:  Soft, nontender,   Extremities: no peripheral edema.  Neurologic: Nonfocal, moving all four extremities  Skin: No lesions       Basic Metabolic Panel:  Recent Labs Lab 05/04/16 1132 05/04/16 1456 05/04/16 1958 05/05/16 0214 05/05/16 0721  NA 126* 126* 126* 126* 127*  K 3.4*  --   --  3.6  --   CL 95*  --   --  93*  --   CO2 22  --   --  23  --   GLUCOSE 101*  --   --  100*  --   BUN 15  --   --  13  --   CREATININE 0.87  --   --  0.90  --   CALCIUM 8.3*  --   --  8.3*  --     Liver Function Tests: No results for input(s): AST, ALT, ALKPHOS, BILITOT, PROT, ALBUMIN in the last 168 hours. No results for input(s): LIPASE, AMYLASE in the last 168 hours. No results for input(s): AMMONIA in the last 168 hours.  CBC:  Recent Labs Lab 05/04/16 1132 05/05/16 0214  WBC 5.4 8.4  NEUTROABS 3.8   --   HGB 7.0* 8.5*  HCT 22.0* 26.3*  MCV 73.0* 75.1*  PLT 232 256    Cardiac Enzymes:  Recent Labs Lab 05/04/16 1456 05/05/16 0214  TROPONINI 0.20* 0.16*    BNP: Invalid input(s): POCBNP  CBG: No results for input(s): GLUCAP in the last 168 hours.  Microbiology: No results found for this or any previous visit.  Coagulation Studies: No results for input(s): LABPROT, INR in the last 72 hours.  Urinalysis:  Recent Labs  05/04/16 1406  COLORURINE YELLOW*  LABSPEC 1.015  PHURINE 5.0  GLUCOSEU NEGATIVE  HGBUR NEGATIVE  BILIRUBINUR NEGATIVE  KETONESUR 5*  PROTEINUR NEGATIVE  NITRITE NEGATIVE  LEUKOCYTESUR NEGATIVE      Imaging: Dg Elbow 2 Views Right  Result Date: 05/04/2016 CLINICAL DATA:  Left elbow pain due to a fall this morning. Initial encounter. EXAM: RIGHT ELBOW - 2 VIEW COMPARISON:  None. FINDINGS: No fracture, dislocation or joint effusion is identified. Soft tissues posterior to the elbow appear mildly swollen. Scattered subcutaneous calcifications posteriorly may be due to phleboliths. IMPRESSION: Negative for fracture dislocation. Possible posterior soft tissue swelling may be due to hematoma. Electronically Signed   By: Maisie Fushomas  Dalessio M.D.   On: 05/04/2016 12:35   Dg Chest Port 1 View  Result Date: 05/05/2016 CLINICAL DATA:  Shortness of breath. EXAM: PORTABLE CHEST 1 VIEW COMPARISON:  01/15/2008 FINDINGS: The heart is enlarged. There is atherosclerosis of the thoracic aorta. Diffuse bilateral reticular opacities with Kerley B-lines consistent with pulmonary edema. Probable left pleural effusion. Left lung base suboptimally assessed due to soft tissue attenuation and portable technique. No confluent airspace disease elsewhere. No pneumothorax. Degenerative change in the spine. IMPRESSION: 1. Cardiomegaly with diffuse reticular opacities most consistent with pulmonary edema. Probable left pleural effusion. Findings suspicious for CHF. 2. Thoracic aortic  atherosclerosis. Electronically Signed   By: Rubye Oaks M.D.   On: 05/05/2016 05:16   Dg Shoulder Left  Result Date: 05/04/2016 CLINICAL DATA:  Fall this morning. Left shoulder pain. Initial encounter. EXAM: LEFT SHOULDER - 2+ VIEW COMPARISON:  None. FINDINGS: There is no evidence of fracture or dislocation. Generalized osteopenia noted. High-riding humeral head seen, consistent with chronic rotator cuff tear or atrophy. IMPRESSION: No acute findings. Findings consistent with chronic rotator cuff tear or atrophy. Electronically Signed   By: Myles Rosenthal M.D.   On: 05/04/2016 12:32   Dg Hip Unilat W Or Wo Pelvis 2-3 Views Left  Result Date: 05/04/2016 CLINICAL DATA:  Left hip pain due to a fall this morning. Initial encounter. EXAM: DG HIP (WITH OR WITHOUT PELVIS) 2-3V LEFT COMPARISON:  None. FINDINGS: No acute bony or joint abnormality is identified. No notable degenerative change about the hips. No focal bony lesion. Degenerative change symphysis pubis somewhat lumbar spine noted. Soft tissues are unremarkable. IMPRESSION: No acute abnormality. Electronically Signed   By: Drusilla Kanner M.D.   On: 05/04/2016 12:31   Dg Hip Unilat W Or Wo Pelvis 2-3 Views Right  Result Date: 05/04/2016 CLINICAL DATA:  Bilateral hip pain due to a fall this morning. EXAM: DG HIP (WITH OR WITHOUT PELVIS) 2-3V RIGHT COMPARISON:  None. FINDINGS: No acute bony or joint abnormality is identified. No notable degenerative change about the hip. No focal bony lesion. Mild degenerative change of the symphysis pubis and lower lumbar spondylosis noted. IMPRESSION: No acute abnormality. Electronically Signed   By: Drusilla Kanner M.D.   On: 05/04/2016 12:31     Medications:    . aspirin EC  81 mg Oral Daily  . citalopram  20 mg Oral Daily  . enoxaparin (LOVENOX) injection  40 mg Subcutaneous Q24H  . ferrous sulfate  325 mg Oral BID WC  . furosemide  40 mg Intravenous Q12H  . lisinopril  5 mg Oral Daily    acetaminophen **OR** acetaminophen, bisacodyl, hydrALAZINE, Influenza vac split quadrivalent PF, LORazepam, ondansetron **OR** ondansetron (ZOFRAN) IV, oxyCODONE-acetaminophen **AND** oxyCODONE, senna-docusate, traMADol  Assessment/ Plan:  Ms. CHASITI WADDINGTON is a 83 y.o. white female with history of hypertension and anemia of chronic disease  1. Hyponatremia: new onset. Most likely due to congestive heart failure and fluid overload - continue fluid restriction and furosemide.   2. Chronic Kidney Disease stage III: creatinine at baseline. Age related and hypertension. History of hyperkalemia  - lisinopril  3. Hypertension: mildly elevated.  - lisinopril and furosemide.   4. Anemia with chronic kidney disease: status post 1 unit PRBC on 1/16.  - discontinue NSAIDs - iron supplements.      LOS: 1 Auston Halfmann 1/17/201810:32 AM

## 2016-05-05 NOTE — Care Management (Signed)
This RNCM attempted to contact [facility] to arrange a follow up appointment but due to weather conditions that office is currently closed. RNCM to continue to try for appointment.

## 2016-05-05 NOTE — Progress Notes (Signed)
Pt c/o of being short of breath. Pt denied being in pain or having chest discomfort. MD notified. Received order for ekg, cxr and troponin to be drawn. 02 sat ws 89% when nurse entered room on 2 liters. Pt was placed on 4 liters, sats went up to 96. When 02 was put back on 2 liters pt maintained at 92-94%

## 2016-05-05 NOTE — Progress Notes (Signed)
Pt complained of having difficulty breathing. Pt denied chest pain or any other discomfort. Dr Sheryle Hailiamond notified. Received order for ativan

## 2016-05-05 NOTE — Progress Notes (Signed)
PT Cancellation Note  Patient Details Name: Courtney Wright MRN: [id number] DOB: 05/17/1924   Cancelled Treatment:    Reason Eval/Treat Not Completed: Patient at procedure or test/unavailable. Patient currently going to Ultrasound to rule out DVT. PT will hold on evaluation until DVT has been ruled out.   Kerin RansomPatrick A McNamara, PT, DPT    05/05/2016, 10:47 AM

## 2016-05-05 NOTE — Progress Notes (Signed)
Called re: acute SOB. Sats 93% on 2L; check troponin, CXR. Start DVT prophylaxis.

## 2016-05-05 NOTE — Clinical Social Work Placement (Signed)
   CLINICAL SOCIAL WORK PLACEMENT  NOTE  Date:  05/05/2016  Patient Details  Name: Courtney GreavesBettie R Vieth MRN: [id number] Date of Birth: 07/13/1924  Clinical Social Work is seeking post-discharge placement for this patient at the Skilled  Nursing Facility level of care (*CSW will initial, date and re-position this form in  chart as items are completed):  Yes   Patient/family provided with [facility] Social Work Department's list of facilities offering this level of care within the geographic area requested by the patient (or if unable, by the patient's family).  Yes   Patient/family informed of their freedom to choose among providers that offer the needed level of care, that participate in Medicare, Medicaid or managed care program needed by the patient, have an available bed and are willing to accept the patient.  Yes   Patient/family informed of [facility]'s ownership interest in [facility] and [facility], as well as of the fact that they are under no obligation to receive care at these facilities.  PASRR submitted to EDS on  (PASARR could not be sumbitted or looked up. PASARR is closed now. )     PASRR number received on       Existing PASRR number confirmed on       FL2 transmitted to all facilities in geographic area requested by pt/family on 05/05/16     FL2 transmitted to all facilities within larger geographic area on       Patient informed that his/her managed care company has contracts with or will negotiate with certain facilities, including the following:            Patient/family informed of bed offers received.  Patient chooses bed at       Physician recommends and patient chooses bed at      Patient to be transferred to   on  .  Patient to be transferred to facility by       Patient family notified on   of transfer.  Name of family member notified:        PHYSICIAN       Additional Comment:     _______________________________________________ Hanadi Stanly, Darleen CrockerBailey M, LCSW 05/05/2016, 5:13 PM

## 2016-05-05 NOTE — Progress Notes (Signed)
[employer] [facility] [place] [facility]   PATIENT NAME: Darci CurrentBettie Strength    MR#:  [id number]  DATE OF BIRTH:  09/25/1924  SUBJECTIVE:  CHIEF COMPLAINT:   Chief Complaint  Patient presents with  . Fall  . Arm Pain  . Shoulder Injury   Better shortness of breath, but still weakness. REVIEW OF SYSTEMS:  Review of Systems  Constitutional: Positive for malaise/fatigue. Negative for chills and fever.  HENT: Negative for congestion.   Eyes: Negative for blurred vision and double vision.  Respiratory: Positive for cough and shortness of breath. Negative for hemoptysis, sputum production, wheezing and stridor.   Cardiovascular: Positive for leg swelling. Negative for chest pain, palpitations and orthopnea.  Gastrointestinal: Negative for abdominal pain, blood in stool, diarrhea, melena, nausea and vomiting.  Genitourinary: Negative for dysuria, hematuria and urgency.  Musculoskeletal: Negative for back pain and joint pain.  Skin: Negative for itching and rash.  Neurological: Positive for weakness. Negative for dizziness, tremors, focal weakness and loss of consciousness.  Psychiatric/Behavioral: Negative for depression. The patient is not nervous/anxious.     DRUG ALLERGIES:   Allergies  Allergen Reactions  . Anaprox  [Naproxen]   . Fenoprofen Calcium    VITALS:  Blood pressure (!) 147/68, pulse 81, temperature 98.4 F (36.9 C), temperature source Oral, resp. rate 18, height 5\' 4"  (1.626 m), weight 160 lb (72.6 kg), SpO2 95 %. PHYSICAL EXAMINATION:  Physical Exam  Constitutional: She is oriented to person, place, and time and well-developed, well-nourished, and in no distress.  HENT:  Head: Normocephalic.  Mouth/Throat: Oropharynx is clear and moist.  Eyes: Conjunctivae and EOM are normal. Pupils are equal, round, and reactive to light.  Neck: Normal range of motion. Neck supple. No JVD present. No tracheal deviation present.  Cardiovascular: Normal rate, regular  rhythm and normal heart sounds.   No murmur heard. Pulmonary/Chest: Effort normal. No respiratory distress. She has no wheezes. She has rales.  Abdominal: Soft. Bowel sounds are normal. She exhibits no distension. There is no tenderness.  Musculoskeletal: Normal range of motion. She exhibits edema. She exhibits no tenderness.  Neurological: She is alert and oriented to person, place, and time. No cranial nerve deficit.  Skin: No rash noted. No erythema.  Psychiatric: Affect and judgment normal.   LABORATORY PANEL:   CBC  Recent Labs Lab 05/05/16 0214  WBC 8.4  HGB 8.5*  HCT 26.3*  PLT 256   ------------------------------------------------------------------------------------------------------------------ Chemistries   Recent Labs Lab 05/05/16 0214  05/05/16 1309  NA 126*  < > 127*  K 3.6  --   --   CL 93*  --   --   CO2 23  --   --   GLUCOSE 100*  --   --   BUN 13  --   --   CREATININE 0.90  --   --   CALCIUM 8.3*  --   --   < > = values in this interval not displayed. RADIOLOGY:  [place] Venous Img Lower Unilateral Right  Result Date: 05/05/2016 CLINICAL DATA:  Right lower extremity edema. EXAM: RIGHT LOWER EXTREMITY VENOUS DOPPLER ULTRASOUND TECHNIQUE: Gray-scale sonography with graded compression, as well as color Doppler and duplex ultrasound were performed to evaluate the lower extremity deep venous systems from the level of the common femoral vein and including the common femoral, femoral, profunda femoral, popliteal and calf veins including the posterior tibial, peroneal and gastrocnemius veins when visible. The superficial great saphenous vein was also interrogated.  Spectral Doppler was utilized to evaluate flow at rest and with distal augmentation maneuvers in the common femoral, femoral and popliteal veins. COMPARISON:  None. FINDINGS: Contralateral Common Femoral Vein: Left common femoral vein is patent without thrombus. Common Femoral Vein: No evidence of thrombus.  Normal compressibility, respiratory phasicity and response to augmentation. Saphenofemoral Junction: No evidence of thrombus. Normal compressibility and flow on color Doppler imaging. Profunda Femoral Vein: No evidence of thrombus. Normal compressibility and flow on color Doppler imaging. Femoral Vein: No evidence of thrombus. Normal compressibility, respiratory phasicity and response to augmentation. Popliteal Vein: No evidence of thrombus. Normal compressibility, respiratory phasicity and response to augmentation. Calf Veins: No evidence of thrombus. Normal compressibility and flow on color Doppler imaging. IMPRESSION: Negative for deep venous thrombosis in right lower extremity. Electronically Signed   By: Richarda Overlie M.D.   On: 05/05/2016 11:32   Dg Chest Port 1 View  Result Date: 05/05/2016 CLINICAL DATA:  Shortness of breath. EXAM: PORTABLE CHEST 1 VIEW COMPARISON:  01/15/2008 FINDINGS: The heart is enlarged. There is atherosclerosis of the thoracic aorta. Diffuse bilateral reticular opacities with Kerley B-lines consistent with pulmonary edema. Probable left pleural effusion. Left lung base suboptimally assessed due to soft tissue attenuation and portable technique. No confluent airspace disease elsewhere. No pneumothorax. Degenerative change in the spine. IMPRESSION: 1. Cardiomegaly with diffuse reticular opacities most consistent with pulmonary edema. Probable left pleural effusion. Findings suspicious for CHF. 2. Thoracic aortic atherosclerosis. Electronically Signed   By: Rubye Oaks M.D.   On: 05/05/2016 05:16   ASSESSMENT AND PLAN:   83 year old female with a history of normocytic anemia who presents with weakness, poor by mouth intake over the past week and a mechanical fall.  1. Acute anemia on chronic anemia: Patient received 1 unit of PRBCs She is guaiac negative Ferrous sulfate started Hb is up to 8.5. Stopped NSAIDs  * Acute CHF, possible diastolic, Chest x-ray show  pulmonary edema. Start Lasix 40 mg IV twice a day and CHF protocol. Follow-up echocardiogram. Cardiology consult.  * Elevated troponin. Due to demanding ischemia. Continue aspirin.  * HTN, accelerated. Continue Lasix twice a day, start lisinopril 5 mg, IV hydralazine when necessary.  2. Hyponatremia, most likely due to congestive heart failure and fluid overload Discontinued IV fluids, fluid restriction. Follow-up BMP.  Normal TSH andT3 but elevated T4 due to history of subclinical hyperthyroidism.  3 Depression on Celexa  4. Right lower extremity edema: No DVT per Doppler ultrasound   5. Weakness with falls and shoulder pain: PT and OT consult No fractures seen on x-rays. Per PT evaluation, the patient needed a skilled nursing facility placement.  All the records are reviewed and case discussed with Care Management/Social Worker. Management plans discussed with the patient, family and they are in agreement.  CODE STATUS: DO NOT RESUSCITATE  TOTAL TIME TAKING CARE OF THIS PATIENT: 38 minutes.   More than 50% of the time was spent in counseling/coordination of care: YES  POSSIBLE D/C IN  2 DAYS, DEPENDING ON CLINICAL CONDITION.   Shaune Pollack M.D on 05/05/2016 at 4:20 PM  Between 7am to 6pm - Pager - [phone]  After 6pm go to [email or web address] - Social research officer, government  [facility]  Office  [phone]  CC: Primary care physician; Lorie Phenix, MD  Note: This dictation was prepared with Dragon dictation along with smaller phrase technology. Any transcriptional errors that result from this process are unintentional.

## 2016-05-05 NOTE — Evaluation (Signed)
Physical Therapy Evaluation Patient Details Name: Courtney GreavesBettie R Wright MRN: [id number] DOB: 03/14/1925 Today's Date: 05/05/2016   History of Present Illness  Patient is a pleasant 83 y/o female that presents after falling out of bed, sustaining bruising but no fractures over L shoulder, R hip, L and R elbows. She has received 1 unit of blood while admitted.   Clinical Impression  Patient admitted after falling out of bed, she did not sustain any fractures but has significant bruising on bilateral forearms and is having more L shoulder pain than normal with limitations in ability to push off and elevate her LUE past 90 degrees of flexion. Patient requires assistance to transfer out of bed, and has her RLE externally rotated due to "R foot pain" from an injury about a month ago she reports. She does not lose balance, but has slow gait pattern indicative of increased falls risk. Given that she lives alone and is going to be limited in her transfers and ability to shower/perform ADLs she would benefit from SNF placement once she is medically appropriate for discharge.     Follow Up Recommendations SNF    Equipment Recommendations       Recommendations for Other Services       Precautions / Restrictions Precautions Precautions: Fall Restrictions Weight Bearing Restrictions: No      Mobility  Bed Mobility Overal bed mobility: Needs Assistance Bed Mobility: Supine to Sit     Supine to sit: Min assist     General bed mobility comments: Patient is able to bring her LEs off the edge of the bed, but requires PT assistance to bring herself to EOB.   Transfers Overall transfer level: Needs assistance Equipment used: Rolling walker (2 wheeled) Transfers: Sit to/from Stand Sit to Stand: Min guard         General transfer comment: Patient is able to perform sit to stand with use of UEs but has pain in L shoulder after fall.   Ambulation/Gait Ambulation/Gait assistance: Min  guard Ambulation Distance (Feet): 100 Feet Assistive device: Rolling walker (2 wheeled) Gait Pattern/deviations: Step-through pattern;Decreased step length - right;Decreased step length - left;Trunk flexed   Gait velocity interpretation: <1.8 ft/sec, indicative of risk for recurrent falls General Gait Details: Patient takes short, slow steps but is able to ambulate ~ 100' with RW and no buckling. Notable for RLE being externally rotated as she reports she hurt her foot a while back and this is more comfortable for her.   Stairs            Wheelchair Mobility    Modified Rankin (Stroke Patients Only)       Balance Overall balance assessment: Needs assistance Sitting-balance support: No upper extremity supported;Feet supported Sitting balance-Leahy Scale: Fair     Standing balance support: Bilateral upper extremity supported Standing balance-Leahy Scale: Fair                               Pertinent Vitals/Pain Pain Assessment: Faces Faces Pain Scale: Hurts even more Pain Location: L shoulder with shoulder flexion.  Pain Descriptors / Indicators: Aching;Sore Pain Intervention(s): Limited activity within patient's tolerance;Monitored during session    Home Living Family/patient expects to be discharged to:: Private residence Living Arrangements: Alone Available Help at Discharge: Family;Available PRN/intermittently Type of Home: House Home Access: Stairs to enter   Entrance Stairs-Number of Steps: 1 Home Layout: One level Home Equipment: Walker - 2 wheels  Prior Function Level of Independence: Independent with assistive device(s)         Comments: Per patient she still drives, daughter stops by 2-3 nights per week, and she has been using RW for mobility.      Hand Dominance        Extremity/Trunk Assessment   Upper Extremity Assessment Upper Extremity Assessment: LUE deficits/detail LUE Deficits / Details: No pain and 5/5 on grip, elbow  flexion and extension. She can only flex her shoulder to ~ 90 degrees.     Lower Extremity Assessment Lower Extremity Assessment: Generalized weakness       Communication   Communication: No difficulties  Cognition Arousal/Alertness: Awake/alert Behavior During Therapy: WFL for tasks assessed/performed Overall Cognitive Status: Within Functional Limits for tasks assessed                      General Comments General comments (skin integrity, edema, etc.): She has bruising over L and R elbows, did not visualize other bruising.     Exercises Other Exercises Other Exercises: 5x sit to stand - 18 seconds with use of UEs.    Assessment/Plan    PT Assessment Patient needs continued PT services  PT Problem List Decreased strength;Decreased mobility;Decreased activity tolerance;Decreased balance;Decreased knowledge of use of DME;Decreased safety awareness;Pain          PT Treatment Interventions DME instruction;Therapeutic activities;Therapeutic exercise;Gait training;Stair training;Balance training;Neuromuscular re-education;Patient/family education    PT Goals (Current goals can be found in the Care Plan section)  Acute Rehab PT Goals Patient Stated Goal: To return home PT Goal Formulation: With patient Time For Goal Achievement: 05/19/16 Potential to Achieve Goals: Fair    Frequency Min 2X/week   Barriers to discharge Decreased caregiver support Patient lives alone and will likely need 24/7 assistance due to L shoulder pain.     Co-evaluation               End of Session Equipment Utilized During Treatment: Gait belt Activity Tolerance: Patient tolerated treatment well Patient left: in chair;with chair alarm set;with call bell/phone within reach Nurse Communication: Mobility status         Time: 1345-1411 PT Time Calculation (min) (ACUTE ONLY): 26 min   Charges:   PT Evaluation $PT Eval Moderate Complexity: 1 Procedure     PT G Codes:        Kerin Ransom, PT, DPT    05/05/2016, 2:55 PM

## 2016-05-05 NOTE — NC FL2 (Signed)
[place] MEDICAID FL2 LEVEL OF CARE SCREENING TOOL     IDENTIFICATION  Patient Name: Courtney Wright Birthdate: 1925/03/06 Sex: female Admission Date (Current Location): 05/04/2016  [facility] and [place] Number:  Chiropodist and Address:  [facility], [address], [place], [place] [address]      Provider Number: [id number]  Attending Physician Name and Address:  Shaune Pollack, MD  Relative Name and Phone Number:       Current Level of Care: Hospital Recommended Level of Care: Skilled Nursing Facility Prior Approval Number:    Date Approved/Denied:   PASRR Number:    Discharge Plan: SNF    Current Diagnoses: Patient Active Problem List   Diagnosis Date Noted  . Anemia 05/04/2016  . Subclinical hyperthyroidism 11/25/2014  . Anemia associated with chronic renal failure 11/11/2014  . Anxiety 11/11/2014  . Chest pain 11/11/2014  . Chronic kidney disease 11/11/2014  . CN (constipation) 11/11/2014  . Cough 11/11/2014  . Accumulation of fluid in tissues 11/11/2014  . BP (high blood pressure) 11/11/2014  . Normocytic anemia 11/11/2014  . Breath shortness 11/11/2014  . Convulsions (HCC) 11/11/2014  . Back pain 11/11/2014  . DJD (degenerative joint disease) 11/04/2014    Orientation RESPIRATION BLADDER Height & Weight     Self, Time, Situation, Place  Normal Continent Weight: 160 lb (72.6 kg) Height:  5\' 4"  (162.6 cm)  BEHAVIORAL SYMPTOMS/MOOD NEUROLOGICAL BOWEL NUTRITION STATUS   (none)  (none) Continent Diet (Regular Diet)  AMBULATORY STATUS COMMUNICATION OF NEEDS Skin   Extensive Assist Verbally Normal                       Personal Care Assistance Level of Assistance  Bathing, Feeding, Dressing Bathing Assistance: Limited assistance Feeding assistance: Independent Dressing Assistance: Limited assistance     Functional Limitations Info  Sight, Hearing, Speech Sight Info: Adequate Hearing Info: Adequate Speech Info:  Adequate    SPECIAL CARE FACTORS FREQUENCY  PT (By licensed PT), OT (By licensed OT)     PT Frequency:  (5) OT Frequency:  (5)            Contractures      Additional Factors Info  Code Status, Allergies Code Status Info:  (DNR )             Current Medications (05/05/2016):  This is the current hospital active medication list Current Facility-Administered Medications  Medication Dose Route Frequency Provider Last Rate Last Dose  . acetaminophen (TYLENOL) tablet 650 mg  650 mg Oral Q6H PRN Adrian Saran, MD       Or  . acetaminophen (TYLENOL) suppository 650 mg  650 mg Rectal Q6H PRN Adrian Saran, MD      . aspirin EC tablet 81 mg  81 mg Oral Daily Shaune Pollack, MD   81 mg at 05/05/16 0921  . bisacodyl (DULCOLAX) EC tablet 5 mg  5 mg Oral Daily PRN Adrian Saran, MD      . citalopram (CELEXA) tablet 20 mg  20 mg Oral Daily Adrian Saran, MD   20 mg at 05/05/16 0921  . enoxaparin (LOVENOX) injection 40 mg  40 mg Subcutaneous Q24H Arnaldo Natal, MD   40 mg at 05/05/16 0617  . ferrous sulfate tablet 325 mg  325 mg Oral BID WC Adrian Saran, MD   325 mg at 05/05/16 0920  . furosemide (LASIX) injection 40 mg  40 mg Intravenous Q12H Shaune Pollack, MD  40 mg at 05/05/16 [id number]  . hydrALAZINE (APRESOLINE) injection 10 mg  10 mg Intravenous Q6H PRN Adrian SaranSital Mody, MD      . Influenza vac split quadrivalent PF (FLUARIX) injection 0.5 mL  0.5 mL Intramuscular Prior to discharge Shaune PollackQing Rickie Gutierres, MD      . lisinopril (PRINIVIL,ZESTRIL) tablet 5 mg  5 mg Oral Daily Shaune PollackQing Santana Edell, MD   5 mg at 05/05/16 0920  . LORazepam (ATIVAN) injection 0.5 mg  0.5 mg Intravenous Q4H PRN Arnaldo NatalMichael S Diamond, MD      . ondansetron [facility](ZOFRAN) tablet 4 mg  4 mg Oral Q6H PRN Adrian SaranSital Mody, MD       Or  . ondansetron (ZOFRAN) injection 4 mg  4 mg Intravenous Q6H PRN Adrian SaranSital Mody, MD      . oxyCODONE-acetaminophen (PERCOCET/ROXICET) 5-325 MG per tablet 1 tablet  1 tablet Oral Q6H PRN Adrian SaranSital Mody, MD   1 tablet at 05/04/16 1558   And  . oxyCODONE  (Oxy IR/ROXICODONE) immediate release tablet 5 mg  5 mg Oral Q6H PRN Sital Mody, MD      . senna-docusate (Senokot-S) tablet 1 tablet  1 tablet Oral QHS PRN Adrian SaranSital Mody, MD      . traMADol (ULTRAM) tablet 50 mg  50 mg Oral Q6H PRN Adrian SaranSital Mody, MD         Discharge Medications: Please see discharge summary for a list of discharge medications.  Relevant Imaging Results:  Relevant Lab Results:   Additional Information  (SSN: [id number])  Sample, Darleen CrockerBailey M, LCSW

## 2016-05-06 ENCOUNTER — Encounter: Payer: Self-pay | Admitting: Physician Assistant

## 2016-05-06 ENCOUNTER — Encounter
Admission: RE | Admit: 2016-05-06 | Discharge: 2016-05-06 | Disposition: A | Discharge status: Home / Self Care | Payer: Medicare Other | Source: Ambulatory Visit | Attending: Internal Medicine | Admitting: Internal Medicine

## 2016-05-06 DIAGNOSIS — D509 Iron deficiency anemia, unspecified: Principal | ICD-10-CM

## 2016-05-06 DIAGNOSIS — R2681 Unsteadiness on feet: Secondary | ICD-10-CM

## 2016-05-06 DIAGNOSIS — R748 Abnormal levels of other serum enzymes: Secondary | ICD-10-CM

## 2016-05-06 DIAGNOSIS — E871 Hypo-osmolality and hyponatremia: Secondary | ICD-10-CM | POA: Diagnosis present

## 2016-05-06 DIAGNOSIS — R7989 Other specified abnormal findings of blood chemistry: Secondary | ICD-10-CM

## 2016-05-06 DIAGNOSIS — R0602 Shortness of breath: Secondary | ICD-10-CM

## 2016-05-06 DIAGNOSIS — R778 Other specified abnormalities of plasma proteins: Secondary | ICD-10-CM

## 2016-05-06 DIAGNOSIS — R0603 Acute respiratory distress: Secondary | ICD-10-CM

## 2016-05-06 DIAGNOSIS — E876 Hypokalemia: Secondary | ICD-10-CM | POA: Diagnosis present

## 2016-05-06 DIAGNOSIS — F0390 Unspecified dementia without behavioral disturbance: Secondary | ICD-10-CM

## 2016-05-06 DIAGNOSIS — J90 Pleural effusion, not elsewhere classified: Secondary | ICD-10-CM

## 2016-05-06 DIAGNOSIS — I119 Hypertensive heart disease without heart failure: Secondary | ICD-10-CM | POA: Diagnosis present

## 2016-05-06 DIAGNOSIS — I5041 Acute combined systolic (congestive) and diastolic (congestive) heart failure: Secondary | ICD-10-CM | POA: Diagnosis present

## 2016-05-06 HISTORY — DX: Schizophrenia, unspecified: F20.9

## 2016-05-06 LAB — BASIC METABOLIC PANEL
Anion gap: 11 (ref 5–15)
BUN: 16 mg/dL (ref 6–20)
CHLORIDE: 91 mmol/L — AB (ref 101–111)
CO2: 27 mmol/L (ref 22–32)
CREATININE: 1.04 mg/dL — AB (ref 0.44–1.00)
Calcium: 8.5 mg/dL — ABNORMAL LOW (ref 8.9–10.3)
GFR, EST AFRICAN AMERICAN: 54 mL/min — AB (ref >60)
GFR, EST NON AFRICAN AMERICAN: 46 mL/min — AB (ref >60)
Glucose, Bld: 82 mg/dL (ref 65–99)
Potassium: 3.3 mmol/L — ABNORMAL LOW (ref 3.5–5.1)
Sodium: 129 mmol/L — ABNORMAL LOW (ref 135–145)

## 2016-05-06 LAB — TYPE AND SCREEN
Blood Product Expiration Date: 201801212359
ISSUE DATE / TIME: 201801161617
UNIT TYPE AND RH: 6200

## 2016-05-06 LAB — SODIUM
SODIUM: 128 mmol/L — AB (ref 135–145)
SODIUM: 128 mmol/L — AB (ref 135–145)

## 2016-05-06 LAB — ECHOCARDIOGRAM COMPLETE
Height: 64 in
Weight: 2560 oz

## 2016-05-06 LAB — HEPATIC FUNCTION PANEL
ALK PHOS: 58 U/L (ref 38–126)
ALT: 16 U/L (ref 14–54)
AST: 32 U/L (ref 15–41)
Albumin: 3.2 g/dL — ABNORMAL LOW (ref 3.5–5.0)
BILIRUBIN INDIRECT: 0.6 mg/dL (ref 0.3–0.9)
BILIRUBIN TOTAL: 0.8 mg/dL (ref 0.3–1.2)
Bilirubin, Direct: 0.2 mg/dL (ref 0.1–0.5)
Total Protein: 6.5 g/dL (ref 6.5–8.1)

## 2016-05-06 LAB — BRAIN NATRIURETIC PEPTIDE: B NATRIURETIC PEPTIDE 5: 1509 pg/mL — AB (ref 0.0–100.0)

## 2016-05-06 LAB — MAGNESIUM: Magnesium: 1.2 mg/dL — ABNORMAL LOW (ref 1.7–2.4)

## 2016-05-06 MED ORDER — POTASSIUM CHLORIDE CRYS ER 20 MEQ PO TBCR
40.0000 meq | EXTENDED_RELEASE_TABLET | Freq: Two times a day (BID) | ORAL | Status: AC
  Administered 2016-05-06 – 2016-05-07 (×2): 40 meq via ORAL
  Filled 2016-05-06 (×2): qty 2

## 2016-05-06 MED ORDER — MAGNESIUM SULFATE 4 GM/100ML IV SOLN
4.0000 g | Freq: Once | INTRAVENOUS | Status: AC
  Administered 2016-05-06: 4 g via INTRAVENOUS
  Filled 2016-05-06: qty 100

## 2016-05-06 MED ORDER — POTASSIUM CHLORIDE CRYS ER 20 MEQ PO TBCR
20.0000 meq | EXTENDED_RELEASE_TABLET | Freq: Two times a day (BID) | ORAL | Status: DC
  Administered 2016-05-07 – 2016-05-09 (×4): 20 meq via ORAL
  Filled 2016-05-06 (×4): qty 1

## 2016-05-06 MED ORDER — MAGNESIUM OXIDE 400 (241.3 MG) MG PO TABS
400.0000 mg | ORAL_TABLET | Freq: Two times a day (BID) | ORAL | Status: DC
  Administered 2016-05-06 – 2016-05-10 (×9): 400 mg via ORAL
  Filled 2016-05-06 (×9): qty 1

## 2016-05-06 MED ORDER — POTASSIUM CHLORIDE CRYS ER 20 MEQ PO TBCR
40.0000 meq | EXTENDED_RELEASE_TABLET | Freq: Once | ORAL | Status: AC
  Administered 2016-05-06: 40 meq via ORAL
  Filled 2016-05-06: qty 2

## 2016-05-06 NOTE — Consult Note (Signed)
Cardiology Consultation Note  Patient ID: Courtney Wright, MRN: [id number], DOB/AGE: [birth year] 83 y.o. Admit date: 05/04/2016   Date of Consult: 05/06/2016 Primary Physician: Lorie Phenix, MD Primary Cardiologist: New to [facility] Requesting Physician: Dr. Imogene Burn, MD  Chief Complaint: Mechanical fall Reason for Consult: Acute systolic and diastolic CHF  HPI: 83 y.o. female with h/o CKD stage III, iron deficiency anemia on long standing po iron therapy, hypertensive heart disease, depression, and morbid obesity who presented to [facility] on 05/04/16 after suffering a mechanical fall and was found to have acute respiratory distress with hypoxia in the setting of acute combined CHF, acute on chronic anemia as above requiring 1 unit pRBC, possible OHS, and deconditioning.   She does not have any previously known cardiac history and has never seen a cardiologist previously. Patient was in her usual state of health until she was getting up out of bed on 05/04/16 when her right foot slipped out from underneath her as she was standing up. She fell along her right side. She did not hit her head or suffer LOC. No preceding chest pain, SOB, palpitations, or dizziness. Because of her fall she was brought to [facility] for further evaluation. Upon her arrival to [facility] she was found to have no acute fractures. HGB was noted to be low at 7.0 with a baseline of 9-10. Sodium was also noted to be low at 126. Potassium 3.6-->3.3, magnesium 1.2, troponin 0.20-->0.16. CXR on 05/05/16 showed pulmonary edema with probable left pleural effusion. She was noted to be SOB on 1/17 with oxygen desaturation into the upper 80's% requiring nasal cannula to maintain a saturation in the mid 90's%. She was started on IV Lasix 40 mg bid with minimal documented UOP, though significant patient reported UOP. Her admission weight was noted to be 160 pounds. She does not weight herself at home though we have a documented weight of 168 pounds at PCP office on  09/08/2015. She reports intermittent LE swelling. No orthopnea. Her PO appetite has been decreasing over the past several weeks. She continues to require oxygen via nasal cannula at 2-3 L at this time. Never with chest pain.   Past Medical History:  Diagnosis Date  . Accident due to mechanical fall without injury   . Acute combined systolic and diastolic CHF, NYHA class 2 (HCC)    a. echo 05/05/16: EF 35-40%, RWMA cannot be excluded, GR1DD, mild MR, LA mildly dilated, RV sys fxan nl, dilated IVC, PASP 74 mmHg  . CKD (chronic kidney disease), stage III   . Depression   . Hypertensive heart disease   . Iron deficiency anemia   . Malnutrition (HCC)   . Morbid obesity (HCC)       Most Recent Cardiac Studies: Echo 05/05/2016: Study Conclusions  - Left ventricle: The cavity size was mildly dilated. There was   mild concentric hypertrophy. Systolic function was moderately   reduced. The estimated ejection fraction was in the range of 35%   to 40%. Diffuse hypokinesis. Regional wall motion abnormalities   cannot be excluded. Doppler parameters are consistent with   abnormal left ventricular relaxation (grade 1 diastolic   dysfunction). - Mitral valve: There was mild regurgitation. - Left atrium: The atrium was mildly dilated. - Right ventricle: Systolic function was normal. - Tricuspid valve: There was mild-moderate regurgitation. - Pulmonary arteries: Systolic pressure was severely elevated. PA   peak pressure: 72 mm Hg (S). - Inferior vena cava: The vessel was not well visualized though  appears dilated.   Surgical History:  Past Surgical History:  Procedure Laterality Date  . ANKLE SURGERY Left   . History of Cataract surgery  2008  . History of cervical discectomy  01/10/2008   Dr. Lovell Sheehan, [facility]; Fusion and Plating at C4-5 and 5-6     Home Meds: Prior to Admission medications   Medication Sig Start Date End Date Taking? Authorizing Provider  acetaminophen (TYLENOL) 500  MG tablet Take 500 mg by mouth every 6 (six) hours as needed for mild pain or moderate pain.   Yes Historical Provider, MD  ibuprofen (ADVIL,MOTRIN) 200 MG tablet Take 200 mg by mouth every 6 (six) hours as needed for mild pain or moderate pain.   Yes Historical Provider, MD  citalopram (CELEXA) 20 MG tablet TAKE ONE (1) TABLET BY MOUTH EVERY DAY Patient not taking: Reported on 05/04/2016 06/26/15   Lorie Phenix, MD  diclofenac sodium (VOLTAREN) 1 % GEL Apply 2 g topically 4 (four) times daily. Reported on 09/08/2015 Patient not taking: Reported on 05/04/2016 09/08/15   Lorie Phenix, MD  furosemide (LASIX) 40 MG tablet Take 1 tablet (40 mg total) by mouth daily. Patient not taking: Reported on 05/04/2016 01/14/16   Margaretann Loveless, PA-C  oxyCODONE-acetaminophen (PERCOCET) 10-325 MG tablet 1 tablet three times daily by mouth. Patient not taking: Reported on 05/04/2016 10/08/15   Lorie Phenix, MD  traMADol (ULTRAM) 50 MG tablet Take 1 tablet (50 mg total) by mouth every 8 (eight) hours as needed. Patient not taking: Reported on 05/04/2016 01/14/16   Margaretann Loveless, PA-C    Inpatient Medications:  . aspirin EC  81 mg Oral Daily  . citalopram  20 mg Oral Daily  . enoxaparin (LOVENOX) injection  40 mg Subcutaneous Q24H  . ferrous sulfate  325 mg Oral BID WC  . furosemide  40 mg Intravenous Q12H  . lisinopril  5 mg Oral Daily  . magnesium oxide  400 mg Oral BID  . [START ON 05/07/2016] potassium chloride  20 mEq Oral BID  . potassium chloride  40 mEq Oral BID     Allergies:  Allergies  Allergen Reactions  . Anaprox  [Naproxen]   . Fenoprofen Calcium     Social History   Social History  . Marital status: Married    Spouse name: N/A  . Number of children: N/A  . Years of education: N/A   Occupational History  . Not on file.   Social History Main Topics  . Smoking status: Former Smoker    Quit date: 04/18/1969  . Smokeless tobacco: Never Used  . Alcohol use 0.0 oz/week      Comment: ocassional  . Drug use: No  . Sexual activity: Not on file   Other Topics Concern  . Not on file   Social History Narrative  . No narrative on file     Family History  Problem Relation Age of Onset  . Dementia Mother   . Alzheimer's disease Mother   . Cerebrovascular Accident Father   . Congestive Heart Failure Father   . Rheumatic fever Father   . Heart disease Sister   . Arthritis Sister   . Diabetes Brother     borderline diabetes  . Cancer Brother     Laryngeal Cancer and breast     Review of Systems: Review of Systems  Constitutional: Positive for malaise/fatigue. Negative for chills, diaphoresis, fever and weight loss.  HENT: Negative for congestion.   Eyes: Negative for discharge  and redness.  Respiratory: Positive for shortness of breath. Negative for cough, hemoptysis, sputum production and wheezing.   Cardiovascular: Positive for leg swelling. Negative for chest pain, palpitations, orthopnea, claudication and PND.  Gastrointestinal: Negative for abdominal pain, blood in stool, constipation, diarrhea, heartburn, melena, nausea and vomiting.  Genitourinary: Negative for hematuria.  Musculoskeletal: Positive for falls and myalgias.  Skin: Negative for rash.  Neurological: Positive for weakness. Negative for dizziness, tingling, tremors, sensory change, speech change, focal weakness, seizures, loss of consciousness and headaches.  Endo/Heme/Allergies: Bruises/bleeds easily.  Psychiatric/Behavioral: Negative for substance abuse. The patient is not nervous/anxious.   All other systems reviewed and are negative.   Labs:  Recent Labs  05/04/16 1456 05/05/16 0214  TROPONINI 0.20* 0.16*   Lab Results  Component Value Date   WBC 8.4 05/05/2016   HGB 8.5 (L) 05/05/2016   HCT 26.3 (L) 05/05/2016   MCV 75.1 (L) 05/05/2016   PLT 256 05/05/2016     Recent Labs Lab 05/06/16 0252 05/06/16 0834  NA 129* 128*  K 3.3*  --   CL 91*  --   CO2 27  --     BUN 16  --   CREATININE 1.04*  --   CALCIUM 8.5*  --   GLUCOSE 82  --    No results found for: CHOL, HDL, LDLCALC, TRIG No results found for: DDIMER  Radiology/Studies:  Dg Elbow 2 Views Right  Result Date: 05/04/2016 CLINICAL DATA:  Left elbow pain due to a fall this morning. Initial encounter. EXAM: RIGHT ELBOW - 2 VIEW COMPARISON:  None. FINDINGS: No fracture, dislocation or joint effusion is identified. Soft tissues posterior to the elbow appear mildly swollen. Scattered subcutaneous calcifications posteriorly may be due to phleboliths. IMPRESSION: Negative for fracture dislocation. Possible posterior soft tissue swelling may be due to hematoma. Electronically Signed   By: Drusilla Kannerhomas  Dalessio M.D.   On: 05/04/2016 12:35   [place] Venous Img Lower Unilateral Right  Result Date: 05/05/2016 CLINICAL DATA:  Right lower extremity edema. EXAM: RIGHT LOWER EXTREMITY VENOUS DOPPLER ULTRASOUND TECHNIQUE: Gray-scale sonography with graded compression, as well as color Doppler and duplex ultrasound were performed to evaluate the lower extremity deep venous systems from the level of the common femoral vein and including the common femoral, femoral, profunda femoral, popliteal and calf veins including the posterior tibial, peroneal and gastrocnemius veins when visible. The superficial great saphenous vein was also interrogated. Spectral Doppler was utilized to evaluate flow at rest and with distal augmentation maneuvers in the common femoral, femoral and popliteal veins. COMPARISON:  None. FINDINGS: Contralateral Common Femoral Vein: Left common femoral vein is patent without thrombus. Common Femoral Vein: No evidence of thrombus. Normal compressibility, respiratory phasicity and response to augmentation. Saphenofemoral Junction: No evidence of thrombus. Normal compressibility and flow on color Doppler imaging. Profunda Femoral Vein: No evidence of thrombus. Normal compressibility and flow on color Doppler imaging.  Femoral Vein: No evidence of thrombus. Normal compressibility, respiratory phasicity and response to augmentation. Popliteal Vein: No evidence of thrombus. Normal compressibility, respiratory phasicity and response to augmentation. Calf Veins: No evidence of thrombus. Normal compressibility and flow on color Doppler imaging. IMPRESSION: Negative for deep venous thrombosis in right lower extremity. Electronically Signed   By: Richarda OverlieAdam  Henn M.D.   On: 05/05/2016 11:32   Dg Chest Port 1 View  Result Date: 05/05/2016 CLINICAL DATA:  Shortness of breath. EXAM: PORTABLE CHEST 1 VIEW COMPARISON:  01/15/2008 FINDINGS: The heart is enlarged. There is atherosclerosis of the  thoracic aorta. Diffuse bilateral reticular opacities with Kerley B-lines consistent with pulmonary edema. Probable left pleural effusion. Left lung base suboptimally assessed due to soft tissue attenuation and portable technique. No confluent airspace disease elsewhere. No pneumothorax. Degenerative change in the spine. IMPRESSION: 1. Cardiomegaly with diffuse reticular opacities most consistent with pulmonary edema. Probable left pleural effusion. Findings suspicious for CHF. 2. Thoracic aortic atherosclerosis. Electronically Signed   By: Rubye Oaks M.D.   On: 05/05/2016 05:16   Dg Shoulder Left  Result Date: 05/04/2016 CLINICAL DATA:  Fall this morning. Left shoulder pain. Initial encounter. EXAM: LEFT SHOULDER - 2+ VIEW COMPARISON:  None. FINDINGS: There is no evidence of fracture or dislocation. Generalized osteopenia noted. High-riding humeral head seen, consistent with chronic rotator cuff tear or atrophy. IMPRESSION: No acute findings. Findings consistent with chronic rotator cuff tear or atrophy. Electronically Signed   By: Myles Rosenthal M.D.   On: 05/04/2016 12:32   Dg Hip Unilat W Or Wo Pelvis 2-3 Views Left  Result Date: 05/04/2016 CLINICAL DATA:  Left hip pain due to a fall this morning. Initial encounter. EXAM: DG HIP (WITH OR  WITHOUT PELVIS) 2-3V LEFT COMPARISON:  None. FINDINGS: No acute bony or joint abnormality is identified. No notable degenerative change about the hips. No focal bony lesion. Degenerative change symphysis pubis somewhat lumbar spine noted. Soft tissues are unremarkable. IMPRESSION: No acute abnormality. Electronically Signed   By: Drusilla Kanner M.D.   On: 05/04/2016 12:31   Dg Hip Unilat W Or Wo Pelvis 2-3 Views Right  Result Date: 05/04/2016 CLINICAL DATA:  Bilateral hip pain due to a fall this morning. EXAM: DG HIP (WITH OR WITHOUT PELVIS) 2-3V RIGHT COMPARISON:  None. FINDINGS: No acute bony or joint abnormality is identified. No notable degenerative change about the hip. No focal bony lesion. Mild degenerative change of the symphysis pubis and lower lumbar spondylosis noted. IMPRESSION: No acute abnormality. Electronically Signed   By: Drusilla Kanner M.D.   On: 05/04/2016 12:31    EKG: Interpreted by me showed: NSR, 86 bpm, short PR interval, lateral TWI Telemetry: Interpreted by me showed: NSR, 80's bpm currently, short 6-beat run of atrial tachycardia at 21:42.  Weights: Filed Weights   05/04/16 1120  Weight: 160 lb (72.6 kg)     Physical Exam: Blood pressure (!) 142/126, pulse 87, temperature 98.2 F (36.8 C), temperature source Oral, resp. rate 18, height 5\' 4"  (1.626 m), weight 160 lb (72.6 kg), SpO2 99 %. Body mass index is 27.46 kg/m. General: Well developed, well nourished, in no acute distress. Head: Normocephalic, atraumatic, sclera non-icteric, no xanthomas, nares are without discharge.  Neck: Negative for carotid bruits. JVD not elevated. Lungs: Decreased, coarse breath sounds bilaterally L>R. Breathing is unlabored on 3 L via nasal cannula.  Heart: RRR with S1 S2. II/VI systolic murmur LUSB, no rubs, or gallops appreciated. Abdomen: Obese, soft, non-tender, non-distended with normoactive bowel sounds. No hepatomegaly. No rebound/guarding. No obvious abdominal  masses. Msk:  Strength and tone appear normal for age. Extremities: No clubbing or cyanosis. No edema. Distal pedal pulses are 2+ and equal bilaterally. Neuro: Alert and oriented X 3. No facial asymmetry. No focal deficit. Moves all extremities spontaneously. Psych:  Responds to questions appropriately with a normal affect.    Assessment and Plan:  Principal Problem:   Acute respiratory distress Active Problems:   Iron deficiency anemia   Acute combined systolic and diastolic CHF, NYHA class 2 (HCC)   Hypertensive heart disease  Hyponatremia   Hypokalemia   Hypomagnesemia   Elevated troponin   CKD (chronic kidney disease), stage III    1. Acute respiratory distress with hypoxia: -Desaturation to the upper 80's% on room, requiring 3 L via nasal cannula at this time -Likely multifactorial including acute combined CHF, acute on chronic iron deficiency anemia, malnutrition with possible 3rd spacing, morbid obesity with possible OHS, and deconditioning  -Wean oxygen as able  2. Acute combined CHF: -No prior ischemic evaluations on file -Unable to exclude ischemia  -Echo with EF of 35-40%, no prior to compare to -BNP pending -Continue IV Lasix 40 mg bid with KCl repletion, will need several days of IV diuresis  -Will need ischemic evaluation as her breathing improves, consider Lexiscan Myoview vs R/LHC prior to discharge after she has adequately diuresed  -Not previously on beta blocker, requiring oxygen currently as above. Look to start beta blocker therapy as breathing improves, possibly over the next 24-36 hours -Continue lisinopril -Consider adding spironolactone prior to discharge   3. Elevated troponin: -Never with chest pain -Mildly elevated at 0.20, down trending -Possibly in the setting of supply demand ischemia 2/2 volume overload, accelerated HTN, fall, and acute on chronic iron deficiency anemia requiring 1 unit of pRBC -Echo as above, unable to exclude  ischemia -Plan for ischemic evaluation as above once respiratory status is improved and her hgb is deemed to be stable  4. Acute on chronic iron deficiency anemia: -Guaiac negative per IM -On longstanding iron therapy at home -Status post 1 unit pRBC this admission -Maintain hgb > 8.5 -Consider checking iron labs and if indicated consider iron infusion inpatient, defer to IM  5. CKD stage III: -Monitor with diuresis   6. Hyponatremia: -Improving with fluid restriction   7. Hypokalemia: -Replete to 4.0  8. Hypomagnesemia: -Replete to 2.0  9. Malnutrition: -Add albumin  10. Hypertensive heart disease: -BP improving -Add beta blocker therapy as above when able 2/2 respiratory status -PRN hydralazine    Signed, Eula Listen, PA-C [facility] Pager: [phone] 05/06/2016, 11:49 AM

## 2016-05-06 NOTE — Care Management (Signed)
[facility] They [facility] still [facility] due to the weather. RNCM to continue to attempt.

## 2016-05-06 NOTE — Progress Notes (Signed)
Clinical Child psychotherapistocial Worker (CSW) contacted patient's daughter Courtney Wright and presented bed offers. [facility].   [facility] Hughes IncorporatedBailey Zareena Willis, LCSW [phone]

## 2016-05-06 NOTE — Progress Notes (Signed)
[facility]  ROUNDING NOTE   Subjective:   Patient breathing better. No more peripheral edema.   Na 128  New diagnosis of systolic congestive heart failure. Cardiology consulted.   Objective:  Vital signs in last 24 hours:  Temp:  [97.3 F (36.3 C)-98.5 F (36.9 C)] 98.2 F (36.8 C) (01/18 1137) Pulse Rate:  [81-87] 87 (01/18 1137) Resp:  [18] 18 (01/18 1137) BP: (135-147)/(49-126) 142/126 (01/18 1137) SpO2:  [88 %-99 %] 99 % (01/18 1137)  Weight change:  Filed Weights   05/04/16 1120  Weight: 72.6 kg (160 lb)    Intake/Output: I/O last 3 completed shifts: In: 736 [P.O.:360; Blood:376] Out: 1525 [Urine:1525]   Intake/Output this shift:  Total I/O In: -  Out: 200 [Urine:200]  Physical Exam: General: NAD, sitting up in chair  Head: Normocephalic, atraumatic. Moist oral mucosal membranes  Eyes: Anicteric, PERRL  Neck: Supple, trachea midline  Lungs:  Clear to auscultation  Heart: Regular rate and rhythm  Abdomen:  Soft, nontender  Extremities: no peripheral edema.  Neurologic: Nonfocal, moving all four extremities  Skin: No lesions       Basic Metabolic Panel:  Recent Labs Lab 05/04/16 1132  05/05/16 0214 05/05/16 0721 05/05/16 1309 05/05/16 1926 05/06/16 0252 05/06/16 0834  NA 126*  < > 126* 127* 127* 127* 129* 128*  K 3.4*  --  3.6  --   --   --  3.3*  --   CL 95*  --  93*  --   --   --  91*  --   CO2 22  --  23  --   --   --  27  --   GLUCOSE 101*  --  100*  --   --   --  82  --   BUN 15  --  13  --   --   --  16  --   CREATININE 0.87  --  0.90  --   --   --  1.04*  --   CALCIUM 8.3*  --  8.3*  --   --   --  8.5*  --   MG  --   --   --   --   --   --   --  1.2*  < > = values in this interval not displayed.  Liver Function Tests: No results for input(s): AST, ALT, ALKPHOS, BILITOT, PROT, ALBUMIN in the last 168 hours. No results for input(s): LIPASE, AMYLASE in the last 168 hours. No results for input(s): AMMONIA in the last  168 hours.  CBC:  Recent Labs Lab 05/04/16 1132 05/05/16 0214  WBC 5.4 8.4  NEUTROABS 3.8  --   HGB 7.0* 8.5*  HCT 22.0* 26.3*  MCV 73.0* 75.1*  PLT 232 256    Cardiac Enzymes:  Recent Labs Lab 05/04/16 1456 05/05/16 0214  TROPONINI 0.20* 0.16*    BNP: Invalid input(s): POCBNP  CBG: No results for input(s): GLUCAP in the last 168 hours.  Microbiology: Results for orders placed or performed during the hospital encounter of 05/04/16  Urine culture     Status: Abnormal   Collection Time: 05/04/16  2:08 PM  Result Value Ref Range Status   Specimen Description URINE, RANDOM  Final   Special Requests NONE  Final   Culture MULTIPLE SPECIES PRESENT, SUGGEST RECOLLECTION (A)  Final   Report Status 05/05/2016 FINAL  Final    Coagulation Studies: No results for input(s): LABPROT, INR in  the last 72 hours.  Urinalysis:  Recent Labs  05/04/16 1406  COLORURINE YELLOW*  LABSPEC 1.015  PHURINE 5.0  GLUCOSEU NEGATIVE  HGBUR NEGATIVE  BILIRUBINUR NEGATIVE  KETONESUR 5*  PROTEINUR NEGATIVE  NITRITE NEGATIVE  LEUKOCYTESUR NEGATIVE      Imaging: US Venous Img Lower Unilateral Right  Result Date: 05/05/2016 CLINICAL DATA:  Right lower extremity edema. EXAM: RIGHT LOWER EXTREMITY VENOUS DOPPLER ULTRASOUND TECHNIQUE: Gray-scale sonography with graded compression, as well as color Doppler and duplex ultrasound were performed to evaluate the lower extremity deep venous systems from the level of the common femoral vein and including the common femoral, femoral, profunda femoral, popliteal and calf veins including the posterior tibial, peroneal and gastrocnemius veins when visible. The superficial great saphenous vein was also interrogated. Spectral Doppler was utilized to evaluate flow at rest and with distal augmentation maneuvers in the common femoral, femoral and popliteal veins. COMPARISON:  None. FINDINGS: Contralateral Common Femoral Vein: Left common femoral vein is  patent without thrombus. Common Femoral Vein: No evidence of thrombus. Normal compressibility, respiratory phasicity and response to augmentation. Saphenofemoral Junction: No evidence of thrombus. Normal compressibility and flow on color Doppler imaging. Profunda Femoral Vein: No evidence of thrombus. Normal compressibility and flow on color Doppler imaging. Femoral Vein: No evidence of thrombus. Normal compressibility, respiratory phasicity and response to augmentation. Popliteal Vein: No evidence of thrombus. Normal compressibility, respiratory phasicity and response to augmentation. Calf Veins: No evidence of thrombus. Normal compressibility and flow on color Doppler imaging. IMPRESSION: Negative for deep venous thrombosis in right lower extremity. Electronically Signed   By: Richarda Overlie M.D.   On: 05/05/2016 11:32   Dg Chest Port 1 View  Result Date: 05/05/2016 CLINICAL DATA:  Shortness of breath. EXAM: PORTABLE CHEST 1 VIEW COMPARISON:  01/15/2008 FINDINGS: The heart is enlarged. There is atherosclerosis of the thoracic aorta. Diffuse bilateral reticular opacities with Kerley B-lines consistent with pulmonary edema. Probable left pleural effusion. Left lung base suboptimally assessed due to soft tissue attenuation and portable technique. No confluent airspace disease elsewhere. No pneumothorax. Degenerative change in the spine. IMPRESSION: 1. Cardiomegaly with diffuse reticular opacities most consistent with pulmonary edema. Probable left pleural effusion. Findings suspicious for CHF. 2. Thoracic aortic atherosclerosis. Electronically Signed   By: Rubye Oaks M.D.   On: 05/05/2016 05:16     Medications:    . aspirin EC  81 mg Oral Daily  . citalopram  20 mg Oral Daily  . enoxaparin (LOVENOX) injection  40 mg Subcutaneous Q24H  . ferrous sulfate  325 mg Oral BID WC  . furosemide  40 mg Intravenous Q12H  . lisinopril  5 mg Oral Daily  . magnesium oxide  400 mg Oral BID  . magnesium sulfate 1  - 4 g bolus IVPB  4 g Intravenous Once  . [START ON 05/07/2016] potassium chloride  20 mEq Oral BID  . potassium chloride  40 mEq Oral BID   acetaminophen **OR** acetaminophen, bisacodyl, hydrALAZINE, Influenza vac split quadrivalent PF, LORazepam, ondansetron **OR** ondansetron (ZOFRAN) IV, oxyCODONE-acetaminophen **AND** oxyCODONE, senna-docusate, traMADol  Assessment/ Plan:  Courtney Wright is a 83 y.o. white female with history of hypertension and anemia of chronic disease  1. Hyponatremia: new onset. New onset systolic congestive heart failure and fluid overload - continue fluid restriction and furosemide.   2. Chronic Kidney Disease stage III: creatinine at baseline. Age related and hypertension. History of hyperkalemia  - lisinopril  3. Hypertension: elevated.  - lisinopril and furosemide.  4. Anemia with chronic kidney disease: status post 1 unit PRBC on 1/16.  - discontinue NSAIDs - iron supplements.    LOS: 2 Krystalle Pilkington 1/18/20181:10 PM

## 2016-05-06 NOTE — Progress Notes (Signed)
[facility] - [place] at [facility]   PATIENT NAME: Courtney Wright    MR#:  [id number]  DATE OF BIRTH:  01/06/1925  SUBJECTIVE:  CHIEF COMPLAINT:   Chief Complaint  Patient presents with  . Fall  . Arm Pain  . Shoulder Injury   Better shortness of breath, but hypoxic last night, put on O2 [place] 2-3 L. REVIEW OF SYSTEMS:  Review of Systems  Constitutional: Positive for malaise/fatigue. Negative for chills and fever.  HENT: Negative for congestion.   Eyes: Negative for blurred vision and double vision.  Respiratory: Positive for cough and shortness of breath. Negative for hemoptysis, sputum production, wheezing and stridor.   Cardiovascular: Positive for leg swelling. Negative for chest pain, palpitations and orthopnea.  Gastrointestinal: Negative for abdominal pain, blood in stool, diarrhea, melena, nausea and vomiting.  Genitourinary: Negative for dysuria, hematuria and urgency.  Musculoskeletal: Negative for back pain and joint pain.  Skin: Negative for itching and rash.  Neurological: Positive for weakness. Negative for dizziness, tremors, focal weakness and loss of consciousness.  Psychiatric/Behavioral: Negative for depression. The patient is not nervous/anxious.     DRUG ALLERGIES:   Allergies  Allergen Reactions  . Anaprox  [Naproxen]   . Fenoprofen Calcium    VITALS:  Blood pressure (!) 134/57, pulse 72, temperature 98.1 F (36.7 C), temperature source Oral, resp. rate 18, height 5\' 4"  (1.626 m), weight 160 lb (72.6 kg), SpO2 97 %. PHYSICAL EXAMINATION:  Physical Exam  Constitutional: She is oriented to person, place, and time and well-developed, well-nourished, and in no distress.  HENT:  Head: Normocephalic.  Mouth/Throat: Oropharynx is clear and moist.  Eyes: Conjunctivae and EOM are normal. Pupils are equal, round, and reactive to light.  Neck: Normal range of motion. Neck supple. No JVD present. No tracheal deviation present.    Cardiovascular: Normal rate, regular rhythm and normal heart sounds.   No murmur heard. Pulmonary/Chest: Effort normal. No respiratory distress. She has no wheezes. She has rales.  Abdominal: Soft. Bowel sounds are normal. She exhibits no distension. There is no tenderness.  Musculoskeletal: Normal range of motion. She exhibits edema. She exhibits no tenderness.  Neurological: She is alert and oriented to person, place, and time. No cranial nerve deficit.  Skin: No rash noted. No erythema.  Large bruises on bilateral arms.  Psychiatric: Affect and judgment normal.   LABORATORY PANEL:   CBC  Recent Labs Lab 05/05/16 0214  WBC 8.4  HGB 8.5*  HCT 26.3*  PLT 256   ------------------------------------------------------------------------------------------------------------------ Chemistries   Recent Labs Lab 05/06/16 0252 05/06/16 0834 05/06/16 1336  NA 129* 128* 128*  K 3.3*  --   --   CL 91*  --   --   CO2 27  --   --   GLUCOSE 82  --   --   BUN 16  --   --   CREATININE 1.04*  --   --   CALCIUM 8.5*  --   --   MG  --  1.2*  --   AST  --   --  32  ALT  --   --  16  ALKPHOS  --   --  58  BILITOT  --   --  0.8   RADIOLOGY:  No results found. ASSESSMENT AND PLAN:   83 year old female with a history of normocytic anemia who presents with weakness, poor by mouth intake over the past week and a mechanical fall.  1. Acute anemia  on chronic anemia: Patient received 1 unit of PRBCs She is guaiac negative Ferrous sulfate started Hb is up to 8.5. Stopped NSAIDs  * Acute respiratory failure with hypoxia due to CHF and anemia. Continue oxygen by nasal cannula, nebulizer when necessary. Continue CHF protocol.  * Acute Combined systolic and diastolic CHF, ejection fraction 35-40% per echo. Chest x-ray show pulmonary edema. Started Lasix 40 mg IV twice a day and CHF protocol.  Per Cardiology consult, Continue IV Lasix 40 mg bid with KCl repletion, will need several days of  IV diuresis  -Will need ischemic evaluation as her breathing improves, consider Lexiscan Myoview vs R/LHC prior to discharge after she has adequately diuresed. Consider adding spironolactone prior to discharge. start beta blocker therapy as breathing improves, possibly over the next 24-36 hours.  * Elevated troponin. Due to demanding ischemia. Continue aspirin. ischemic evaluation as above once respiratory status is improved and her hgb is deemed to be stable.  * HTN, accelerated. Continue Lasix twice a day, started lisinopril 5 mg, IV hydralazine when necessary.  2. Hyponatremia, most likely due to congestive heart failure and fluid overload Discontinued IV fluids, fluid restriction. Follow-up BMP.  Normal TSH andT3 but elevated T4 due to history of subclinical hyperthyroidism.  3 Depression on Celexa  4. Right lower extremity edema: No DVT per Doppler ultrasound  5. Weakness with falls and shoulder pain: PT and OT consult No fractures seen on x-rays. Per PT evaluation, the patient needed a skilled nursing facility placement.  Hypokalemia. Give potassium supplement and follow-up level. Hypomagnesemia. Give IV and by mouth magnesium, follow-up level. Chronic Kidney Disease stage III: creatinine at baseline.   I discussed with Dr. Mariah MillingGollan and Dr. Wynelle LinkKolluru. All the records are reviewed and case discussed with Care Management/Social Worker. Management plans discussed with the patient, family and they are in agreement.  CODE STATUS: DO NOT RESUSCITATE  TOTAL TIME TAKING CARE OF THIS PATIENT: 38 minutes.   More than 50% of the time was spent in counseling/coordination of care: YES  POSSIBLE D/C IN  2 DAYS, DEPENDING ON CLINICAL CONDITION.   [name], Amorie Rentz M.D on 05/06/2016 at 3:05 PM  Between 7am to 6pm - Pager - [phone]  After 6pm go to [email or web address] - Social research officer, governmentpassword [email or web address]  [facility]  Office  [phone]  CC: Primary care physician; Lorie PhenixNancy  Maloney, MD  Note: This dictation was prepared with Dragon dictation along with smaller phrase technology. Any transcriptional errors that result from this process are unintentional.

## 2016-05-06 NOTE — Evaluation (Signed)
Occupational Therapy Evaluation Patient Details Name: Courtney Wright MRN: [id number] DOB: 08/15/1924 Today's Date: 05/06/2016    History of Present Illness Patient is a pleasant 83 y/o female that presents after falling out of bed, sustaining bruising but no fractures over L shoulder, R hip, L and R elbows. She has received 1 unit of blood while admitted.    Clinical Impression   Pt tolerated session well. Pt required some verbal cues during session for hand placement for transfers to improve safety and became fatigued easily after bed mobility, ambulating with RW to/from bathroom to perform toileting, and coming back to bed. Pt presents with decreased strength/act tol/knowledge of ECS and AE for ADL, hx of falls, and increased risk of falls (admitted s/p fall at home), and increased need for assist with ADL. Pt will benefit from skilled OT services to address noted impairments and functional deficits in order to return to PLOF. Recommend SNF for STR prior to return home. Recommend shower chair (w/ back rest, bilateral arm rails).    Follow Up Recommendations  SNF    Equipment Recommendations  Tub/shower seat    Recommendations for Other Services       Precautions / Restrictions Precautions Precautions: Fall Restrictions Weight Bearing Restrictions: No      Mobility Bed Mobility Overal bed mobility: Needs Assistance Bed Mobility: Supine to Sit     Supine to sit: Min assist     General bed mobility comments: required min assist to help scoot EOB and for swinging BLE back into bed, mod I for scooting up in bed   Transfers Overall transfer level: Needs assistance Equipment used: Rolling walker (2 wheeled) Transfers: Sit to/from Stand Sit to Stand: Min guard              Balance Overall balance assessment: Needs assistance Sitting-balance support: No upper extremity supported;Feet supported Sitting balance-Leahy Scale: Fair     Standing balance support:  Bilateral upper extremity supported Standing balance-Leahy Scale: Fair                              ADL Overall ADL's : Needs assistance/impaired                 Upper Body Dressing : Set up;Sitting;Supervision/safety;Cueing for compensatory techniques Upper Body Dressing Details (indicate cue type and reason): cueing for compensatory technique to minimize risk of pain to L shoulder, as pt winced when attempting L shoulder flexion but initially denied pain Lower Body Dressing: Sitting/lateral leans;Supervision/safety;Set up;Minimal assistance;Sit to/from stand   Toilet Transfer: Scientist, clinical (histocompatibility and immunogenetics)Min guard;RW;Regular Toilet Toilet Transfer Details (indicate cue type and reason): with cuing for hand placement  Toileting- Clothing Manipulation and Hygiene: Minimal assistance;Sit to/from stand Toileting - Clothing Manipulation Details (indicate cue type and reason): min assist for clothing mgt of hospital gown and tab style disposable brief while pt stood with BUE support on RW     Functional mobility during ADLs: Min guard;Rolling walker General ADL Comments: pt requiring overall minimal assist for ADL tasks      Vision Vision Assessment?: No apparent visual deficits   Perception     Praxis      Pertinent Vitals/Pain Pain Assessment: No/denies pain Pain Intervention(s): Monitored during session     Hand Dominance     Extremity/Trunk Assessment Upper Extremity Assessment Upper Extremity Assessment: LUE deficits/detail;RUE deficits/detail RUE Deficits / Details: generlized weakness, 3+/5 LUE Deficits / Details: AROM to ~90 degrees without pain,  full PROM; 2+/5 shoulder flexion, 3+/5 elbow flexion, grasp WFL   Lower Extremity Assessment Lower Extremity Assessment: Generalized weakness       Communication Communication Communication: No difficulties   Cognition Arousal/Alertness: Awake/alert Behavior During Therapy: WFL for tasks assessed/performed Overall Cognitive  Status: Within Functional Limits for tasks assessed                     General Comments       Exercises       Shoulder Instructions      Home Living Family/patient expects to be discharged to:: Private residence Living Arrangements: Alone Available Help at Discharge: Family;Available PRN/intermittently Type of Home: House Home Access: Stairs to enter Entrance Stairs-Number of Steps: 1   Home Layout: One level     Bathroom Shower/Tub: Tub/shower unit;Walk-in shower (uses walk in shower) Shower/tub characteristics: Door Bathroom Toilet: Handicapped height Bathroom Accessibility: No (leaves RW outside bathroom and holds onto doorway, sink for UE support)   Home Equipment: Walker - 2 wheels;Cane - single point          Prior Functioning/Environment Level of Independence: Independent with assistive device(s)        Comments: Per patient she still drives, daughter stops by 2-3 nights per week, and she has been using RW for mobility.         OT Problem List: Decreased strength;Decreased range of motion;Decreased activity tolerance;Decreased safety awareness;Decreased knowledge of use of DME or AE;Impaired balance (sitting and/or standing)   OT Treatment/Interventions: Self-care/ADL training;Therapeutic exercise;Therapeutic activities;Energy conservation;DME and/or AE instruction;Patient/family education    OT Goals(Current goals can be found in the care plan section) Acute Rehab OT Goals Patient Stated Goal: get better OT Goal Formulation: With patient Time For Goal Achievement: 05/20/16 Potential to Achieve Goals: Good  OT Frequency: Min 1X/week   Barriers to D/C: Decreased caregiver support          Co-evaluation              End of Session Equipment Utilized During Treatment: Gait belt  Activity Tolerance: Patient tolerated treatment well Patient left: in bed;with call bell/phone within reach;with chair alarm set   Time: 4098-1191 OT Time  Calculation (min): 50 min Charges:  OT General Charges $OT Visit: 1 Procedure OT Evaluation $OT Eval Low Complexity: 1 Procedure OT Treatments $Self Care/Home Management : 38-52 mins G-Codes:    Eliezer Bottom, OTR/L 05/06/2016, 3:44 PM

## 2016-05-07 ENCOUNTER — Telehealth: Payer: Self-pay | Admitting: Family Medicine

## 2016-05-07 ENCOUNTER — Encounter: Payer: Self-pay | Admitting: Student-PharmD

## 2016-05-07 LAB — BASIC METABOLIC PANEL
ANION GAP: 8 (ref 5–15)
BUN: 18 mg/dL (ref 6–20)
CALCIUM: 8.5 mg/dL — AB (ref 8.9–10.3)
CHLORIDE: 93 mmol/L — AB (ref 101–111)
CO2: 30 mmol/L (ref 22–32)
Creatinine, Ser: 1.12 mg/dL — ABNORMAL HIGH (ref 0.44–1.00)
GFR calc non Af Amer: 42 mL/min — ABNORMAL LOW (ref >60)
GFR, EST AFRICAN AMERICAN: 49 mL/min — AB (ref >60)
GLUCOSE: 83 mg/dL (ref 65–99)
POTASSIUM: 4.1 mmol/L (ref 3.5–5.1)
Sodium: 131 mmol/L — ABNORMAL LOW (ref 135–145)

## 2016-05-07 LAB — MAGNESIUM: Magnesium: 2.2 mg/dL (ref 1.7–2.4)

## 2016-05-07 MED ORDER — FUROSEMIDE 40 MG PO TABS
40.0000 mg | ORAL_TABLET | Freq: Every day | ORAL | Status: DC
  Administered 2016-05-08 – 2016-05-10 (×3): 40 mg via ORAL
  Filled 2016-05-07 (×3): qty 1

## 2016-05-07 MED ORDER — METOPROLOL TARTRATE 25 MG PO TABS
12.5000 mg | ORAL_TABLET | Freq: Two times a day (BID) | ORAL | Status: DC
  Administered 2016-05-07 – 2016-05-09 (×5): 12.5 mg via ORAL
  Filled 2016-05-07 (×4): qty 1
  Filled 2016-05-07: qty 0.5

## 2016-05-07 NOTE — Care Management Important Message (Signed)
Important Message  Patient Details  Name: Jeronimo GreavesBettie R Ketron MRN: [id number] Date of Birth: 03/25/1925   Medicare Important Message Given:  Yes Copy of signe IM delivered to patient.    Collie SiadAngela Yassir Enis, RN 05/07/2016, 8:04 AM

## 2016-05-07 NOTE — Progress Notes (Signed)
Progress Note  Patient Name: Courtney Wright Date of Encounter: 05/07/2016  Primary Cardiologist: Concha Se, MD   Subjective   No chest pain or sob.  Able to lie relatively flat.  Inpatient Medications    Scheduled Meds: . aspirin EC  81 mg Oral Daily  . citalopram  20 mg Oral Daily  . enoxaparin (LOVENOX) injection  40 mg Subcutaneous Q24H  . ferrous sulfate  325 mg Oral BID WC  . furosemide  40 mg Intravenous Q12H  . lisinopril  5 mg Oral Daily  . magnesium oxide  400 mg Oral BID  . potassium chloride  20 mEq Oral BID   Continuous Infusions:  PRN Meds: acetaminophen **OR** acetaminophen, bisacodyl, hydrALAZINE, Influenza vac split quadrivalent PF, LORazepam, ondansetron **OR** ondansetron (ZOFRAN) IV, oxyCODONE-acetaminophen **AND** oxyCODONE, senna-docusate, traMADol   Vital Signs    Vitals:   05/06/16 2328 05/07/16 0340 05/07/16 0500 05/07/16 0744  BP: (!) 137/55 124/66  (!) 131/51  Pulse: 76 76  86  Resp: 18 20  18   Temp: 97.4 F (36.3 C) 98.7 F (37.1 C)  98.2 F (36.8 C)  TempSrc: Oral Oral  Oral  SpO2: 100% (!) 88%  97%  Weight:   165 lb 5 oz (75 kg)   Height:        Intake/Output Summary (Last 24 hours) at 05/07/16 1037 Last data filed at 05/07/16 1014  Gross per 24 hour  Intake              120 ml  Output                0 ml  Net              120 ml   Filed Weights   05/04/16 1120 05/07/16 0500  Weight: 160 lb (72.6 kg) 165 lb 5 oz (75 kg)    Physical Exam   GEN: Well nourished, well developed, in no acute distress.  HEENT: Grossly normal.  Neck: Supple, no JVD, carotid bruits, or masses. Cardiac: RRR, 2/6 SEM loudest @ bilat USB, heard throughout.  No rubs, or gallops. No clubbing, cyanosis, edema.  Radials/DP/PT 2+ and equal bilaterally.  Respiratory:  Respirations regular and unlabored, relatively clear with faint exp wheezing. GI: Soft, nontender, nondistended, BS + x 4. MS: no deformity or atrophy. Skin: warm and dry, no  rash. Neuro:  Strength and sensation are intact. Psych: AAOx3.  Normal affect.  Labs    Chemistry Recent Labs Lab 05/05/16 0214  05/06/16 0252 05/06/16 0834 05/06/16 1336 05/07/16 0448  NA 126*  < > 129* 128* 128* 131*  K 3.6  --  3.3*  --   --  4.1  CL 93*  --  91*  --   --  93*  CO2 23  --  27  --   --  30  GLUCOSE 100*  --  82  --   --  83  BUN 13  --  16  --   --  18  CREATININE 0.90  --  1.04*  --   --  1.12*  CALCIUM 8.3*  --  8.5*  --   --  8.5*  PROT  --   --   --   --  6.5  --   ALBUMIN  --   --   --   --  3.2*  --   AST  --   --   --   --  32  --  ALT  --   --   --   --  16  --   ALKPHOS  --   --   --   --  58  --   BILITOT  --   --   --   --  0.8  --   GFRNONAA 55*  --  46*  --   --  42*  GFRAA >60  --  54*  --   --  49*  ANIONGAP 10  --  11  --   --  8  < > = values in this interval not displayed.   Hematology Recent Labs Lab 05/04/16 1132 05/05/16 0214  WBC 5.4 8.4  RBC 3.02* 3.51*  HGB 7.0* 8.5*  HCT 22.0* 26.3*  MCV 73.0* 75.1*  MCH 23.3* 24.2*  MCHC 32.0 32.2  RDW 16.0* 16.3*  PLT 232 256    Cardiac Enzymes Recent Labs Lab 05/04/16 1456 05/05/16 0214  TROPONINI 0.20* 0.16*     BNP Recent Labs Lab 05/06/16 1130  BNP 1,509.0*    Radiology    [place] Venous Img Lower Unilateral Right  Result Date: 05/05/2016 CLINICAL DATA:  Right lower extremity edema. EXAM: RIGHT LOWER EXTREMITY VENOUS DOPPLER ULTRASOUND TECHNIQUE: Gray-scale sonography with graded compression, as well as color Doppler and duplex ultrasound were performed to evaluate the lower extremity deep venous systems from the level of the common femoral vein and including the common femoral, femoral, profunda femoral, popliteal and calf veins including the posterior tibial, peroneal and gastrocnemius veins when visible. The superficial great saphenous vein was also interrogated. Spectral Doppler was utilized to evaluate flow at rest and with distal augmentation maneuvers in the common  femoral, femoral and popliteal veins. COMPARISON:  None. FINDINGS: Contralateral Common Femoral Vein: Left common femoral vein is patent without thrombus. Common Femoral Vein: No evidence of thrombus. Normal compressibility, respiratory phasicity and response to augmentation. Saphenofemoral Junction: No evidence of thrombus. Normal compressibility and flow on color Doppler imaging. Profunda Femoral Vein: No evidence of thrombus. Normal compressibility and flow on color Doppler imaging. Femoral Vein: No evidence of thrombus. Normal compressibility, respiratory phasicity and response to augmentation. Popliteal Vein: No evidence of thrombus. Normal compressibility, respiratory phasicity and response to augmentation. Calf Veins: No evidence of thrombus. Normal compressibility and flow on color Doppler imaging. IMPRESSION: Negative for deep venous thrombosis in right lower extremity. Electronically Signed   By: Richarda OverlieAdam  Henn M.D.   On: 05/05/2016 11:32    Telemetry    RSR - Personally Reviewed  Cardiac Studies   2D Echocardiogram 1.17.2018  Study Conclusions   - Left ventricle: The cavity size was mildly dilated. There was   mild concentric hypertrophy. Systolic function was moderately   reduced. The estimated ejection fraction was in the range of 35%   to 40%. Diffuse hypokinesis. Regional wall motion abnormalities   cannot be excluded. Doppler parameters are consistent with   abnormal left ventricular relaxation (grade 1 diastolic   dysfunction). - Mitral valve: There was mild regurgitation. - Left atrium: The atrium was mildly dilated. - Right ventricle: Systolic function was normal. - Tricuspid valve: There was mild-moderate regurgitation. - Pulmonary arteries: Systolic pressure was severely elevated. PA   peak pressure: 72 mm Hg (S). - Inferior vena cava: The vessel was not well visualized though   appears dilated.   Patient Profile     83 y.o. female with h/o CKD stage III, iron  deficiency anemia on long standing po iron therapy, hypertensive heart  disease, depression, and morbid obesity who presented to [facility] on 05/04/16 after suffering a mechanical fall and was found to have acute respiratory distress with hypoxia in the setting of acute combined CHF, acute on chronic anemia as above requiring 1 unit pRBC, possible OHS, and deconditioning.   Assessment & Plan    1.  Acute combined systolic and diastolic CHF/acute hypoxic resp failure:  Pt presented following fall w/ respiratory distress and hypoxia.  Volume overloaded on exam initially. CXR w/ CHF.  EF 35-40% w/ diff HK.  BNP 1509 on 1/18.  She has been on IV lasix.  Net + for admission, though I/O inaccurate (only 200 out recorded 1/18 with no intake).  Wt listed as up 5 lbs.  BUN/Creat/Bicarb bumping very slightly.  Euvolemic on exam.  Transition to PO lasix.  Will also add low dose  blocker today with plan to transition to long acting tomorrow.  Won't use coreg in setting of occas, faint exp wheeze.  If creat/K stable in AM, could consider spiro, though would need to be very careful w/ K in this elderly lady.  2.  Elevated troponin:  Mild trop elevation with flat trend.  Peak 0.20.  No chest pain.  Suspect demand ischemia in setting of resp distress/chf.  Now stable.  Could consider lexi MV to r/o ischemia - ? timing.  Cont acei.  Add  blocker.  F/u lipids.  ? Role of statin in 83 y/o ?.  3.  Acute on chronic IDA:  S/p 1 unit prbc's this admission.  H/H 8.5/26.3.  May be contributing to demand ischemia.  Would consider further PRBC's to achieve hgb > 10.  4.  CKD III:  Very slight bump in bun/creat/bicarb w/ diuresis.  Will transition to PO lasix.  5.  Hyponatremia:  Improved.  6.  Hypomagnesemia:  Mg now nl.  Signed, Nicolasa Ducking, NP  05/07/2016, 10:37 AM

## 2016-05-07 NOTE — Care Management (Addendum)
[facility]: Courtney SpurrJenny Wright, [place] Appointment scheduled Feb 12, 2:00. Patient updated.

## 2016-05-07 NOTE — Progress Notes (Signed)
Occupational Therapy Treatment Patient Details Name: NYKERRIA MACCONNELL MRN: [id number] DOB: 1925-03-26 Today's Date: 05/07/2016    History of present illness Patient is a pleasant 83 y/o female that presents after falling out of bed, sustaining bruising but no fractures over L shoulder, R hip, L and R elbows. She has received 1 unit of blood while admitted.    OT comments  Pt seen for toileting using FWW and assist needed by CNA for clean up mainly due to having on long gown and depends which was falling down when standing.  Her daugther was present for last few minutes of session and reviewed rec of walker bag, reacher, removing all throw rugs and consider padding the corners of bedside table.  Pt has pain in R knee and needed cues to not turn her pelvis out and keep in alignment iwth knee when standing at sink to wash hands.  Pt has continued pain in R shoulder with rotation but overall improved.  [facility] most [facility] Monday after receiving antibiotics and Lasix per daughter's report. Pt is making good progress with ADLs.  Follow Up Recommendations  SNF    Equipment Recommendations  Tub/shower seat    Recommendations for Other Services      Precautions / Restrictions Precautions Precautions: Fall Restrictions Weight Bearing Restrictions: No       Mobility Bed Mobility                  Transfers                      Balance                                   ADL Overall ADL's : Needs assistance/impaired                                       General ADL Comments: Pt seen for toileting using FWW and assist needed by CNA for clean up mainly due to having on long gown and depends which was falling down when standing.  Her daugther was present for last few minutes of session and reviewed rec of walker bag, reacher, removing all throw rugs and consider padding the corners of bedside table.  Pt has pain in R knee and needed  cues to not turn her pelvis out and keep in alignment iwth knee when standing at sink to wash hands.  Pt has continued pain in R shoulder with rotation but overall improved.        Vision                     Perception     Praxis      Cognition   Behavior During Therapy: WFL for tasks assessed/performed Overall Cognitive Status: Within Functional Limits for tasks assessed                       Extremity/Trunk Assessment               Exercises     Shoulder Instructions       General Comments      Pertinent Vitals/ Pain       Pain Assessment: Faces Pain Score: 2  Pain Location: R shoulder with rotation Pain Descriptors /  Indicators: Aching;Sore Pain Intervention(s): Monitored during session  Home Living                                          Prior Functioning/Environment              Frequency  Min 1X/week        Progress Toward Goals  OT Goals(current goals can now be found in the care plan section)  Progress towards OT goals: Progressing toward goals     Plan Discharge plan remains appropriate    Co-evaluation                 End of Session Equipment Utilized During Treatment: Gait belt   Activity Tolerance Patient tolerated treatment well   Patient Left in bed;with call bell/phone within reach;with chair alarm set;with family/visitor present (daughter present)   Nurse Communication          Time: 1610-9604: 1530-1615 OT Time Calculation (min): 45 min  Charges: OT General Charges $OT Visit: 1 Procedure OT Treatments $Self Care/Home Management : 38-52 mins  Susanne BordersSusan Mechel Schutter, OTR/L ascom [phone] 05/07/16, 4:24 PM

## 2016-05-07 NOTE — Progress Notes (Signed)
[employer] [facility] - [place] at [facility]   PATIENT NAME: Courtney Wright    MR#:  [id number]  DATE OF BIRTH:  [birth year]  SUBJECTIVE:  CHIEF COMPLAINT:   Chief Complaint  Patient presents with  . Fall  . Arm Pain  . Shoulder Injury   Still cough and shortness of breath,  on O2 [place] 2-3 L. REVIEW OF SYSTEMS:  Review of Systems  Constitutional: Positive for malaise/fatigue. Negative for chills and fever.  HENT: Negative for congestion.   Eyes: Negative for blurred vision and double vision.  Respiratory: Positive for cough and shortness of breath. Negative for hemoptysis, sputum production, wheezing and stridor.   Cardiovascular: Positive for leg swelling. Negative for chest pain, palpitations and orthopnea.  Gastrointestinal: Negative for abdominal pain, blood in stool, diarrhea, melena, nausea and vomiting.  Genitourinary: Negative for dysuria, hematuria and urgency.  Musculoskeletal: Negative for back pain and joint pain.  Skin: Negative for itching and rash.  Neurological: Positive for weakness. Negative for dizziness, tremors, focal weakness and loss of consciousness.  Psychiatric/Behavioral: Negative for depression. The patient is not nervous/anxious.     DRUG ALLERGIES:   Allergies  Allergen Reactions  . Anaprox  [Naproxen]   . Fenoprofen Calcium    VITALS:  Blood pressure (!) 124/44, pulse 71, temperature 97.9 F (36.6 C), temperature source Oral, resp. rate 18, height 5\' 4"  (1.626 m), weight 165 lb 5 oz (75 kg), SpO2 100 %. PHYSICAL EXAMINATION:  Physical Exam  Constitutional: She is oriented to person, place, and time and well-developed, well-nourished, and in no distress.  HENT:  Head: Normocephalic.  Mouth/Throat: Oropharynx is clear and moist.  Eyes: Conjunctivae and EOM are normal. Pupils are equal, round, and reactive to light.  Neck: Normal range of motion. Neck supple. No JVD present. No tracheal deviation present.  Cardiovascular: Normal  rate, regular rhythm and normal heart sounds.   No murmur heard. Pulmonary/Chest: Effort normal. No respiratory distress. She has no wheezes. She has rales.  Abdominal: Soft. Bowel sounds are normal. She exhibits no distension. There is no tenderness.  Musculoskeletal: Normal range of motion. She exhibits edema. She exhibits no tenderness.  Neurological: She is alert and oriented to person, place, and time. No cranial nerve deficit.  Skin: No rash noted. No erythema.  Large bruises on bilateral arms.  Psychiatric: Affect and judgment normal.   LABORATORY PANEL:   CBC  Recent Labs Lab 05/05/16 0214  WBC 8.4  HGB 8.5*  HCT 26.3*  PLT 256   ------------------------------------------------------------------------------------------------------------------ Chemistries   Recent Labs Lab 05/06/16 1336 05/07/16 0448  NA 128* 131*  K  --  4.1  CL  --  93*  CO2  --  30  GLUCOSE  --  83  BUN  --  18  CREATININE  --  1.12*  CALCIUM  --  8.5*  MG  --  2.2  AST 32  --   ALT 16  --   ALKPHOS 58  --   BILITOT 0.8  --    RADIOLOGY:  No results found. ASSESSMENT AND PLAN:   83 year old female with a history of normocytic anemia who presents with weakness, poor by mouth intake over the past week and a mechanical fall.  1. Acute anemia on chronic anemia: Patient received 1 unit of PRBCs She is guaiac negative Ferrous sulfate started Hb is up to 8.5. Stopped NSAIDs  * Acute respiratory failure with hypoxia due to CHF and anemia. Continue oxygen by nasal cannula,  nebulizer when necessary. Continue CHF protocol.  * Acute Combined systolic and diastolic CHF, ejection fraction 35-40% per echo. Chest x-ray show pulmonary edema. Started Lasix 40 mg IV twice a day and CHF protocol.  Per Cardiology consult, Continue IV Lasix 40 mg bid with KCl repletion, will need several days of IV diuresis  -Will need ischemic evaluation as her breathing improves, consider Lexiscan Myoview vs R/LHC  prior to discharge after she has adequately diuresed. Consider adding spironolactone prior to discharge. Started lopressor 12.5 mg bid today.  * Elevated troponin. Due to demanding ischemia. Continue aspirin. ischemic evaluation as above once respiratory status is improved and her hgb is deemed to be stable.  * HTN, better controlled. Continue Lasix twice a day, started lisinopril 5 mg, lopressor 12.5 mg bid,  IV hydralazine when necessary.  2. Hyponatremia, most likely due to congestive heart failure and fluid overload Discontinued IV fluids, fluid restriction. Follow-up BMP.  Normal TSH andT3 but elevated T4 due to history of subclinical hyperthyroidism.  3 Depression on Celexa  4. Right lower extremity edema: No DVT per Doppler ultrasound  5. Weakness with falls and shoulder pain: PT and OT consult No fractures seen on x-rays. Per PT evaluation, the patient needs a skilled nursing facility placement.  Hypokalemia. Given potassium supplement and improved. Hypomagnesemia. Given IV and by mouth magnesium, improved. Chronic Kidney Disease stage III: creatinine at baseline.   All the records are reviewed and case discussed with Care Management/Social Worker. Management plans discussed with the patient, family and they are in agreement.  CODE STATUS: DO NOT RESUSCITATE  TOTAL TIME TAKING CARE OF THIS PATIENT: 37 minutes.   More than 50% of the time was spent in counseling/coordination of care: YES  POSSIBLE D/C IN  2-3 DAYS, DEPENDING ON CLINICAL CONDITION.   [name], [name] on 05/07/2016 at 3:09 PM  Between 7am to 6pm - Pager - [phone]  After 6pm go to [email or web address] - Social research officer, governmentpassword [email or web address]  [facility]  Office  [phone]  CC: Primary care physician; Lorie PhenixNancy Maloney, MD  Note: This dictation was prepared with Dragon dictation along with smaller phrase technology. Any transcriptional errors that result from this process are unintentional.

## 2016-05-07 NOTE — Progress Notes (Signed)
Per MD patient will likely not be ready for D/C until Monday 05/10/16. [facility]. [facility] has been [facility]. [facility] aware of above. CSW will continue to follow and assist as needed.   Baker Hughes IncorporatedBailey Karry Causer, LCSW [phone]

## 2016-05-08 LAB — LIPID PANEL
CHOL/HDL RATIO: 2.5 ratio
Cholesterol: 120 mg/dL (ref 0–200)
HDL: 48 mg/dL (ref >40)
LDL CALC: 60 mg/dL (ref 0–99)
Triglycerides: 62 mg/dL (ref <150)
VLDL: 12 mg/dL (ref 0–40)

## 2016-05-08 LAB — BASIC METABOLIC PANEL
ANION GAP: 6 (ref 5–15)
BUN: 25 mg/dL — ABNORMAL HIGH (ref 6–20)
CALCIUM: 8.5 mg/dL — AB (ref 8.9–10.3)
CO2: 30 mmol/L (ref 22–32)
Chloride: 98 mmol/L — ABNORMAL LOW (ref 101–111)
Creatinine, Ser: 1.29 mg/dL — ABNORMAL HIGH (ref 0.44–1.00)
GFR calc Af Amer: 41 mL/min — ABNORMAL LOW (ref >60)
GFR calc non Af Amer: 36 mL/min — ABNORMAL LOW (ref >60)
GLUCOSE: 99 mg/dL (ref 65–99)
POTASSIUM: 4.7 mmol/L (ref 3.5–5.1)
Sodium: 134 mmol/L — ABNORMAL LOW (ref 135–145)

## 2016-05-08 MED ORDER — SODIUM CHLORIDE 0.9 % IV SOLN
400.0000 mg | Freq: Once | INTRAVENOUS | Status: AC
  Administered 2016-05-08: 400 mg via INTRAVENOUS
  Filled 2016-05-08: qty 20

## 2016-05-08 MED ORDER — TRAMADOL HCL 50 MG PO TABS: 50.0000 mg | ORAL_TABLET | Freq: Four times a day (QID) | ORAL | Status: DC | PRN

## 2016-05-08 NOTE — Progress Notes (Signed)
On assessment patient alert and oriented to person, situation, and place. Lung sounds clear. Patient states "I have more energy today". Patient has been weaned off of oxygen. No complaints of pain.   Harvie HeckMelanie Areen Trautner, RN

## 2016-05-08 NOTE — Progress Notes (Signed)
Patient is alert and oriented to person, place, and situation. Patient has been more lethargic today than previous days. Patient also complaining of more pain in her left shoulder. MD ordered tramadol for pain and Hgb to be drawn in the morning. Patient currently unsafe to get to go to bathroom so staff is placing patient on bed pan.   Courtney HeckMelanie Jacqueleen Pulver, RN

## 2016-05-08 NOTE — Progress Notes (Signed)
   Subjective: Pt sleepy  Denies CP  No SOB  Objective: Vitals:   05/08/16 0407 05/08/16 0450 05/08/16 0749 05/08/16 1034  BP: (!) 110/96  (!) 125/43 128/68  Pulse: (!) 41 79 76 72  Resp: 18  16 18   Temp: 98.8 F (37.1 C)  99.4 F (37.4 C) 99.5 F (37.5 C)  TempSrc: Oral  Oral Oral  SpO2: 90%  92% 92%  Weight: 163 lb 1.6 oz (74 kg)     Height:       Weight change: -2 lb 3.4 oz (-1.004 kg)  Intake/Output Summary (Last 24 hours) at 05/08/16 1034 Last data filed at 05/08/16 0530  Gross per 24 hour  Intake              840 ml  Output                0 ml  Net              840 ml    General: Alert, awake, oriented x3, in no acute distress Neck:  JVP is normal Heart: Regular rate and rhythm, without murmurs, rubs, gallops.  Lungs: Rel clear   Exemities:  No edema.   Neuro: Grossly intact, nonfocal.   Lab Results: Results for orders placed or performed during the hospital encounter of 05/04/16 (from the past 24 hour(s))  Basic metabolic panel     Status: Abnormal   Collection Time: 05/08/16  3:48 AM  Result Value Ref Range   Sodium 134 (L) 135 - 145 mmol/L   Potassium 4.7 3.5 - 5.1 mmol/L   Chloride 98 (L) 101 - 111 mmol/L   CO2 30 22 - 32 mmol/L   Glucose, Bld 99 65 - 99 mg/dL   BUN 25 (H) 6 - 20 mg/dL   Creatinine, Ser 1.191.29 (H) 0.44 - 1.00 mg/dL   Calcium 8.5 (L) 8.9 - 10.3 mg/dL   GFR calc non Af Amer 36 (L) >60 mL/min   GFR calc Af Amer 41 (L) >60 mL/min   Anion gap 6 5 - 15  Lipid panel     Status: None   Collection Time: 05/08/16  3:48 AM  Result Value Ref Range   Cholesterol 120 0 - 200 mg/dL   Triglycerides 62 <147<150 mg/dL   HDL 48 >82>40 mg/dL   Total CHOL/HDL Ratio 2.5 RATIO   VLDL 12 0 - 40 mg/dL   LDL Cholesterol 60 0 - 99 mg/dL    Studies/Results: No results found.  Medications: review  @PROBHOSP @  1  Acute on chronic combined systolic/diastolic CHF  Volume is not too bad  May be up a little  Note switch to PO lasix today  Bump in Cr  Will check  labs in am including BNP  Sats marginal but pt comfortable in bed  2  Elevated trop  Reviewed with pt's family  Reluctant to pursue aggressive course  Therefore I would hold off on stress testing as they do not want invasive eval to potentially follw    3.  Neuro  Pt sleepier today  Better day yesterday  I have held narcotics and benzos  Follow    LOS: 4 days   Dietrich Patesaula Ross 05/08/2016, 10:34 AM

## 2016-05-08 NOTE — Progress Notes (Signed)
Patient ID: Courtney Wright, female   DOB: 05-15-1924, 83 y.o.   MRN: [id number]  [facility] PROGRESS NOTE  ERNESTA TRABERT WUJ:[id number] DOB: 1925-01-28 DOA: 05/04/2016 PCP: Lorie Phenix, MD  HPI/Subjective: Patient feeling better. Still with some shortness of breath and cough.  Objective: Vitals:   05/08/16 1034 05/08/16 1414  BP: 128/68 125/79  Pulse: 72 (!) 44  Resp: 18 16  Temp: 99.5 F (37.5 C) 98.8 F (37.1 C)    Filed Weights   05/04/16 1120 05/07/16 0500 05/08/16 0407  Weight: 72.6 kg (160 lb) 75 kg (165 lb 5 oz) 74 kg (163 lb 1.6 oz)    ROS: Review of Systems  Constitutional: Negative for chills and fever.  Eyes: Negative for blurred vision.  Respiratory: Positive for cough and shortness of breath.   Cardiovascular: Negative for chest pain.  Gastrointestinal: Negative for abdominal pain, constipation, diarrhea, nausea and vomiting.  Genitourinary: Negative for dysuria.  Musculoskeletal: Negative for joint pain.  Neurological: Negative for dizziness and headaches.   Exam: Physical Exam  HENT:  Nose: No mucosal edema.  Mouth/Throat: No oropharyngeal exudate or posterior oropharyngeal edema.  Eyes: Conjunctivae, EOM and lids are normal. Pupils are equal, round, and reactive to light.  Neck: No JVD present. Carotid bruit is not present. No edema present. No thyroid mass and no thyromegaly present.  Cardiovascular: S1 normal and S2 normal.  Exam reveals no gallop.   Murmur heard.  Systolic murmur is present with a grade of 2/6  Pulses:      Dorsalis pedis pulses are 2+ on the right side, and 2+ on the left side.  Respiratory: No respiratory distress. She has no wheezes. She has rhonchi in the right lower field and the left lower field. She has no rales.  GI: Soft. Bowel sounds are normal. There is no tenderness.  Musculoskeletal:       Right ankle: She exhibits swelling.       Left ankle: She exhibits swelling.  Lymphadenopathy:    She has no cervical  adenopathy.  Neurological: She is alert. No cranial nerve deficit.  Skin: Skin is warm. No rash noted. Nails show no clubbing.  Psychiatric: She has a normal mood and affect.      Data Reviewed: Basic Metabolic Panel:  Recent Labs Lab 05/04/16 1132  05/05/16 0214  05/06/16 0252 05/06/16 0834 05/06/16 1336 05/07/16 0448 05/08/16 0348  NA 126*  < > 126*  < > 129* 128* 128* 131* 134*  K 3.4*  --  3.6  --  3.3*  --   --  4.1 4.7  CL 95*  --  93*  --  91*  --   --  93* 98*  CO2 22  --  23  --  27  --   --  30 30  GLUCOSE 101*  --  100*  --  82  --   --  83 99  BUN 15  --  13  --  16  --   --  18 25*  CREATININE 0.87  --  0.90  --  1.04*  --   --  1.12* 1.29*  CALCIUM 8.3*  --  8.3*  --  8.5*  --   --  8.5* 8.5*  MG  --   --   --   --   --  1.2*  --  2.2  --   < > = values in this interval not displayed. Liver Function Tests:  Recent  Labs Lab 05/06/16 1336  AST 32  ALT 16  ALKPHOS 58  BILITOT 0.8  PROT 6.5  ALBUMIN 3.2*   CBC:  Recent Labs Lab 05/04/16 1132 05/05/16 0214  WBC 5.4 8.4  NEUTROABS 3.8  --   HGB 7.0* 8.5*  HCT 22.0* 26.3*  MCV 73.0* 75.1*  PLT 232 256   Cardiac Enzymes:  Recent Labs Lab 05/04/16 1456 05/05/16 0214  TROPONINI 0.20* 0.16*   BNP (last 3 results)  Recent Labs  05/06/16 1130  BNP 1,509.0*     Recent Results (from the past 240 hour(s))  Urine culture     Status: Abnormal   Collection Time: 05/04/16  2:08 PM  Result Value Ref Range Status   Specimen Description URINE, RANDOM  Final   Special Requests NONE  Final   Culture MULTIPLE SPECIES PRESENT, SUGGEST RECOLLECTION (A)  Final   Report Status 05/05/2016 FINAL  Final      Scheduled Meds: . aspirin EC  81 mg Oral Daily  . citalopram  20 mg Oral Daily  . enoxaparin (LOVENOX) injection  40 mg Subcutaneous Q24H  . ferrous sulfate  325 mg Oral BID WC  . furosemide  40 mg Oral Daily  . lisinopril  5 mg Oral Daily  . magnesium oxide  400 mg Oral BID  . metoprolol  tartrate  12.5 mg Oral BID  . potassium chloride  20 mEq Oral BID    Assessment/Plan:  1. Acute on chronic iron deficiency anemia. Patient received 1 unit of packed red blood cells while being here. We'll give IV Venofer today. Stop aspirin and Lovenox. 2. Acute hypoxic respiratory failure. This has resolved patient is on room air. 3. Acute systolic congestive heart failure with cardiomyopathy. Lasix switched oral for tomorrow. Continue metoprolol and lisinopril for cardiomyopathy. 4. Elevated troponin demand ischemia from CHF 5. Accelerated hypertension. Blood pressure better controlled 6. Depression on Celexa 7. Edema lower extremities improved with Lasix 8. Weakness. Physical therapy recommends rehabilitation  Code Status:     Code Status Orders        Start     Ordered   05/04/16 1443  Do not attempt resuscitation (DNR)  Continuous    Question Answer Comment  In the event of cardiac or respiratory ARREST Do not call a "code blue"   In the event of cardiac or respiratory ARREST Do not perform Intubation, CPR, defibrillation or ACLS   In the event of cardiac or respiratory ARREST Use medication by any route, position, wound care, and other measures to relive pain and suffering. May use oxygen, suction and manual treatment of airway obstruction as needed for comfort.      05/04/16 1442    Code Status History    Date Active Date Inactive Code Status Order ID Comments User Context   This patient has a current code status but no historical code status.    Advance Directive Documentation   Flowsheet Row Most Recent Value  Type of Advance Directive  Healthcare Power of Attorney, Living will  Pre-existing out of facility DNR order (yellow form or pink MOST form)  No data  "MOST" Form in Place?  No data      Disposition Plan: Rehabilitation soon  Consultants:  Cardiology  Time spent: 28 minutes  Alford HighlandWIETING, Deni Lefever  [facility] [employer]

## 2016-05-08 NOTE — Progress Notes (Signed)
Physical Therapy Treatment Patient Details Name: MARGEE TRENTHAM MRN: [id number] DOB: 09-15-1924 Today's Date: 05/08/2016    History of Present Illness Patient is a pleasant 83 y/o female that presents after falling out of bed, sustaining bruising but no fractures over L shoulder, R hip, L and R elbows. She has received 1 unit of blood while admitted.     PT Comments    Pt in bed, generally lethargic keeping eyes closed unless prompted during session.  Participated in exercises as described below in attempts to awaken pt fully for mobility.  To edge of bed with mod a x 1 and increased time.  Sitting edge of bed with min guard.  She was able to stand briefly with mod assist x 1 but due to L shoulder pain and R elbow pain, was unable to step in place as she uses BUE for support with gait.  She was assisted back to bed with mod a x 2.    Pt tolerated session poorly this pm with significant increase in assist needed and unable to ambulate.  Daughter in for session.   Follow Up Recommendations  SNF     Equipment Recommendations       Recommendations for Other Services       Precautions / Restrictions Precautions Precautions: Fall Restrictions Weight Bearing Restrictions: No    Mobility  Bed Mobility Overal bed mobility: Needs Assistance Bed Mobility: Supine to Sit;Sit to Supine     Supine to sit: Mod assist Sit to supine: +2 for physical assistance;Max assist   General bed mobility comments: required overall more assist today with all mobility  Transfers Overall transfer level: Needs assistance Equipment used: Rolling walker (2 wheeled) Transfers: Sit to/from Stand              Ambulation/Gait Ambulation/Gait assistance: Mod assist           General Gait Details: unable to safely attempt gait, unable to step in place due to L shoulder pain and inability to use it assist with weight bearing.   Stairs            Wheelchair Mobility    Modified Rankin  (Stroke Patients Only)       Balance Overall balance assessment: Needs assistance Sitting-balance support: No upper extremity supported;Feet supported Sitting balance-Leahy Scale: Fair     Standing balance support: Bilateral upper extremity supported Standing balance-Leahy Scale: Poor                      Cognition Arousal/Alertness: Lethargic Behavior During Therapy: WFL for tasks assessed/performed Overall Cognitive Status: Within Functional Limits for tasks assessed                      Exercises Other Exercises Other Exercises: general LE exercises  BLE in supine for ankle pumps, heel slides, SLR, AB/AD    General Comments        Pertinent Vitals/Pain Pain Assessment: 0-10 Pain Score: 7  Pain Location: R shoulder, L elbow Pain Descriptors / Indicators: Aching;Sore    Home Living                      Prior Function            PT Goals (current goals can now be found in the care plan section)      Frequency    Min 2X/week      PT Plan Current plan remains appropriate  Co-evaluation             End of Session Equipment Utilized During Treatment: Gait belt Activity Tolerance: Patient limited by lethargy;Patient limited by pain Patient left: in bed;with call bell/phone within reach;with family/visitor present;with bed alarm set     Time: 1452-1515 PT Time Calculation (min) (ACUTE ONLY): 23 min  Charges:  $Therapeutic Exercise: 8-22 mins $Therapeutic Activity: 8-22 mins                    G Codes:      Danielle DessSarah Veanna Dower 05/08/2016, 3:59 PM

## 2016-05-09 LAB — BASIC METABOLIC PANEL
ANION GAP: 6 (ref 5–15)
BUN: 26 mg/dL — ABNORMAL HIGH (ref 6–20)
CO2: 30 mmol/L (ref 22–32)
Calcium: 8.4 mg/dL — ABNORMAL LOW (ref 8.9–10.3)
Chloride: 99 mmol/L — ABNORMAL LOW (ref 101–111)
Creatinine, Ser: 1.22 mg/dL — ABNORMAL HIGH (ref 0.44–1.00)
GFR calc Af Amer: 44 mL/min — ABNORMAL LOW (ref >60)
GFR calc non Af Amer: 38 mL/min — ABNORMAL LOW (ref >60)
GLUCOSE: 101 mg/dL — AB (ref 65–99)
POTASSIUM: 4.8 mmol/L (ref 3.5–5.1)
Sodium: 135 mmol/L (ref 135–145)

## 2016-05-09 LAB — CBC
HCT: 24.8 % — ABNORMAL LOW (ref 35.0–47.0)
Hemoglobin: 8 g/dL — ABNORMAL LOW (ref 12.0–16.0)
MCH: 25 pg — AB (ref 26.0–34.0)
MCHC: 32.2 g/dL (ref 32.0–36.0)
MCV: 77.6 fL — AB (ref 80.0–100.0)
Platelets: 251 10*3/uL (ref 150–440)
RBC: 3.2 MIL/uL — AB (ref 3.80–5.20)
RDW: 17.4 % — ABNORMAL HIGH (ref 11.5–14.5)
WBC: 5 10*3/uL (ref 3.6–11.0)

## 2016-05-09 LAB — BRAIN NATRIURETIC PEPTIDE: B Natriuretic Peptide: 1446 pg/mL — ABNORMAL HIGH (ref 0.0–100.0)

## 2016-05-09 MED ORDER — CARVEDILOL 6.25 MG PO TABS
12.5000 mg | ORAL_TABLET | Freq: Two times a day (BID) | ORAL | Status: DC
  Administered 2016-05-09 – 2016-05-10 (×2): 12.5 mg via ORAL
  Filled 2016-05-09 (×2): qty 2

## 2016-05-09 MED ORDER — LISINOPRIL 20 MG PO TABS
20.0000 mg | ORAL_TABLET | Freq: Every day | ORAL | Status: DC
  Administered 2016-05-09 – 2016-05-10 (×2): 20 mg via ORAL
  Filled 2016-05-09 (×2): qty 1

## 2016-05-09 MED ORDER — QUETIAPINE FUMARATE 25 MG PO TABS
25.0000 mg | ORAL_TABLET | Freq: Every day | ORAL | Status: DC
  Administered 2016-05-09: 25 mg via ORAL
  Filled 2016-05-09: qty 1

## 2016-05-09 NOTE — Progress Notes (Signed)
Patient ID: Courtney Wright, female   DOB: 05/03/1924, 83 y.o.   MRN: [id number]  [facility] PROGRESS NOTE  LAVETTE YANKOVICH [id number]:[id number] DOB: 10-Dec-1924 DOA: 05/04/2016 PCP: Lorie Phenix, MD  HPI/Subjective: Patient feeling a little bit better today than yesterday. Yesterday was a little more sleepy. As per the nursing staff, she did not sleep last night either.  Objective: Vitals:   05/09/16 0718 05/09/16 1132  BP: (!) 166/52 (!) 172/65  Pulse: 80 68  Resp: 18 18  Temp: 98.1 F (36.7 C) 98.1 F (36.7 C)    Filed Weights   05/07/16 0500 05/08/16 0407 05/09/16 0500  Weight: 75 kg (165 lb 5 oz) 74 kg (163 lb 1.6 oz) 75.2 kg (165 lb 12.8 oz)    ROS: Review of Systems  Constitutional: Negative for chills and fever.  Eyes: Negative for blurred vision.  Respiratory: Positive for cough and shortness of breath.   Cardiovascular: Negative for chest pain.  Gastrointestinal: Negative for abdominal pain, constipation, diarrhea, nausea and vomiting.  Genitourinary: Negative for dysuria.  Musculoskeletal: Negative for joint pain.  Neurological: Negative for dizziness and headaches.   Exam: Physical Exam  HENT:  Nose: No mucosal edema.  Mouth/Throat: No oropharyngeal exudate or posterior oropharyngeal edema.  Eyes: Conjunctivae, EOM and lids are normal. Pupils are equal, round, and reactive to light.  Neck: No JVD present. Carotid bruit is not present. No edema present. No thyroid mass and no thyromegaly present.  Cardiovascular: S1 normal and S2 normal.  Exam reveals no gallop.   Murmur heard.  Systolic murmur is present with a grade of 2/6  Pulses:      Dorsalis pedis pulses are 2+ on the right side, and 2+ on the left side.  Respiratory: No respiratory distress. She has no wheezes. She has rhonchi in the right lower field and the left lower field. She has no rales.  GI: Soft. Bowel sounds are normal. There is no tenderness.  Musculoskeletal:       Right ankle: She  exhibits swelling.       Left ankle: She exhibits swelling.  Lymphadenopathy:    She has no cervical adenopathy.  Neurological: She is alert. No cranial nerve deficit.  Skin: Skin is warm. No rash noted. Nails show no clubbing.  Psychiatric: She has a normal mood and affect.      Data Reviewed: Basic Metabolic Panel:  Recent Labs Lab 05/05/16 0214  05/06/16 0252 05/06/16 0834 05/06/16 1336 05/07/16 0448 05/08/16 0348 05/09/16 0424  NA 126*  < > 129* 128* 128* 131* 134* 135  K 3.6  --  3.3*  --   --  4.1 4.7 4.8  CL 93*  --  91*  --   --  93* 98* 99*  CO2 23  --  27  --   --  30 30 30   GLUCOSE 100*  --  82  --   --  83 99 101*  BUN 13  --  16  --   --  18 25* 26*  CREATININE 0.90  --  1.04*  --   --  1.12* 1.29* 1.22*  CALCIUM 8.3*  --  8.5*  --   --  8.5* 8.5* 8.4*  MG  --   --   --  1.2*  --  2.2  --   --   < > = values in this interval not displayed. Liver Function Tests:  Recent Labs Lab 05/06/16 1336  AST 32  ALT  16  ALKPHOS 58  BILITOT 0.8  PROT 6.5  ALBUMIN 3.2*   CBC:  Recent Labs Lab 05/04/16 1132 05/05/16 0214 05/09/16 0424  WBC 5.4 8.4 5.0  NEUTROABS 3.8  --   --   HGB 7.0* 8.5* 8.0*  HCT 22.0* 26.3* 24.8*  MCV 73.0* 75.1* 77.6*  PLT 232 256 251   Cardiac Enzymes:  Recent Labs Lab 05/04/16 1456 05/05/16 0214  TROPONINI 0.20* 0.16*   BNP (last 3 results)  Recent Labs  05/06/16 1130 05/09/16 0424  BNP 1,509.0* 1,446.0*     Recent Results (from the past 240 hour(s))  Urine culture     Status: Abnormal   Collection Time: 05/04/16  2:08 PM  Result Value Ref Range Status   Specimen Description URINE, RANDOM  Final   Special Requests NONE  Final   Culture MULTIPLE SPECIES PRESENT, SUGGEST RECOLLECTION (A)  Final   Report Status 05/05/2016 FINAL  Final      Scheduled Meds: . carvedilol  12.5 mg Oral BID WC  . citalopram  20 mg Oral Daily  . ferrous sulfate  325 mg Oral BID WC  . furosemide  40 mg Oral Daily  . lisinopril   5 mg Oral Daily  . magnesium oxide  400 mg Oral BID  . potassium chloride  20 mEq Oral BID  . QUEtiapine  25 mg Oral QHS    Assessment/Plan:  1. Acute on chronic iron deficiency anemia. Patient received 1 unit of packed red blood cells while being here. I gave IV Venofer yesterday. Stop aspirin and Lovenox. Today's hemoglobin 8.0. Patient did not want another transfusion today. Check hemoglobin again tomorrow. 2. Acute hypoxic respiratory failure. This has resolved patient is on room air. 3. Acute systolic congestive heart failure with cardiomyopathy. Lasix switched oral for tomorrow. Continue Coreg and lisinopril for cardiomyopathy. 4. Elevated troponin demand ischemia from CHF 5. Accelerated hypertension. Blood pressure is high today. Increase lisinopril to 20 mg daily 6. Depression on Celexa 7. Edema lower extremities improved with Lasix 8. Weakness. Physical therapy recommends rehabilitation 9. Not sleeping while here in the hospital. Trial of Seroquel at night  Code Status:     Code Status Orders        Start     Ordered   05/04/16 1443  Do not attempt resuscitation (DNR)  Continuous    Question Answer Comment  In the event of cardiac or respiratory ARREST Do not call a "code blue"   In the event of cardiac or respiratory ARREST Do not perform Intubation, CPR, defibrillation or ACLS   In the event of cardiac or respiratory ARREST Use medication by any route, position, wound care, and other measures to relive pain and suffering. May use oxygen, suction and manual treatment of airway obstruction as needed for comfort.      05/04/16 1442    Code Status History    Date Active Date Inactive Code Status Order ID Comments User Context   This patient has a current code status but no historical code status.    Advance Directive Documentation   Flowsheet Row Most Recent Value  Type of Advance Directive  Healthcare Power of Attorney, Living will  Pre-existing out of facility DNR  order (yellow form or pink MOST form)  No data  "MOST" Form in Place?  No data      Disposition Plan: Rehabilitation Potentially tomorrow  Consultants:  Cardiology  Time spent: 26 minutes  Alford HighlandWIETING, Antoni Stefan  [employer]

## 2016-05-09 NOTE — Progress Notes (Signed)
[facility]  ROUNDING NOTE   Subjective:   Daughter at bedside.  Patient less confused this morning.  Na 135 (134) Creatinine 1.22  hgb 8 - given IV venofer  Furosemide 40mg  PO daily  Objective:  Vital signs in last 24 hours:  Temp:  [98.1 F (36.7 C)-98.8 F (37.1 C)] 98.1 F (36.7 C) (01/21 1132) Pulse Rate:  [44-80] 68 (01/21 1132) Resp:  [16-19] 18 (01/21 1132) BP: (125-172)/(52-92) 172/65 (01/21 1132) SpO2:  [90 %-98 %] 98 % (01/21 1132) Weight:  [75.2 kg (165 lb 12.8 oz)] 75.2 kg (165 lb 12.8 oz) (01/21 0500)  Weight change: 1.225 kg (2 lb 11.2 oz) Filed Weights   05/07/16 0500 05/08/16 0407 05/09/16 0500  Weight: 75 kg (165 lb 5 oz) 74 kg (163 lb 1.6 oz) 75.2 kg (165 lb 12.8 oz)    Intake/Output: I/O last 3 completed shifts: In: 630 [P.O.:360; IV Piggyback:270] Out: -    Intake/Output this shift:  Total I/O In: 0  Out: 350 [Urine:350]  Physical Exam: General: NAD, sitting up in chair  Head: Normocephalic, atraumatic. Moist oral mucosal membranes  Eyes: Anicteric, PERRL  Neck: Supple, trachea midline  Lungs:  Clear to auscultation  Heart: Regular rate and rhythm  Abdomen:  Soft, nontender  Extremities: no peripheral edema.  Neurologic: Nonfocal, moving all four extremities  Skin: No lesions       Basic Metabolic Panel:  Recent Labs Lab 05/05/16 0214  05/06/16 0252 05/06/16 0834 05/06/16 1336 05/07/16 0448 05/08/16 0348 05/09/16 0424  NA 126*  < > 129* 128* 128* 131* 134* 135  K 3.6  --  3.3*  --   --  4.1 4.7 4.8  CL 93*  --  91*  --   --  93* 98* 99*  CO2 23  --  27  --   --  30 30 30   GLUCOSE 100*  --  82  --   --  83 99 101*  BUN 13  --  16  --   --  18 25* 26*  CREATININE 0.90  --  1.04*  --   --  1.12* 1.29* 1.22*  CALCIUM 8.3*  --  8.5*  --   --  8.5* 8.5* 8.4*  MG  --   --   --  1.2*  --  2.2  --   --   < > = values in this interval not displayed.  Liver Function Tests:  Recent Labs Lab 05/06/16 1336  AST 32   ALT 16  ALKPHOS 58  BILITOT 0.8  PROT 6.5  ALBUMIN 3.2*   No results for input(s): LIPASE, AMYLASE in the last 168 hours. No results for input(s): AMMONIA in the last 168 hours.  CBC:  Recent Labs Lab 05/04/16 1132 05/05/16 0214 05/09/16 0424  WBC 5.4 8.4 5.0  NEUTROABS 3.8  --   --   HGB 7.0* 8.5* 8.0*  HCT 22.0* 26.3* 24.8*  MCV 73.0* 75.1* 77.6*  PLT 232 256 251    Cardiac Enzymes:  Recent Labs Lab 05/04/16 1456 05/05/16 0214  TROPONINI 0.20* 0.16*    BNP: Invalid input(s): POCBNP  CBG: No results for input(s): GLUCAP in the last 168 hours.  Microbiology: Results for orders placed or performed during the hospital encounter of 05/04/16  Urine culture     Status: Abnormal   Collection Time: 05/04/16  2:08 PM  Result Value Ref Range Status   Specimen Description URINE, RANDOM  Final  Special Requests NONE  Final   Culture MULTIPLE SPECIES PRESENT, SUGGEST RECOLLECTION (A)  Final   Report Status 05/05/2016 FINAL  Final    Coagulation Studies: No results for input(s): LABPROT, INR in the last 72 hours.  Urinalysis: No results for input(s): COLORURINE, LABSPEC, PHURINE, GLUCOSEU, HGBUR, BILIRUBINUR, KETONESUR, PROTEINUR, UROBILINOGEN, NITRITE, LEUKOCYTESUR in the last 72 hours.  Invalid input(s): APPERANCEUR    Imaging: No results found.   Medications:    . citalopram  20 mg Oral Daily  . ferrous sulfate  325 mg Oral BID WC  . furosemide  40 mg Oral Daily  . lisinopril  5 mg Oral Daily  . magnesium oxide  400 mg Oral BID  . metoprolol tartrate  12.5 mg Oral BID  . potassium chloride  20 mEq Oral BID  . QUEtiapine  25 mg Oral QHS   acetaminophen **OR** acetaminophen, bisacodyl, hydrALAZINE, Influenza vac split quadrivalent PF, ondansetron **OR** ondansetron (ZOFRAN) IV, senna-docusate, traMADol  Assessment/ Plan:  Ms. Courtney Wright is a 83 y.o. white female with history of hypertension and anemia of chronic disease  1. Hyponatremia:  new onset. New onset systolic congestive heart failure and fluid overload. Sodium now at goal. - continue fluid restriction and furosemide.   2. Chronic Kidney Disease stage III: creatinine at baseline. Age related and hypertension. History of hyperkalemia  - lisinopril  3. Hypertension: elevated.  - lisinopril and furosemide.   4. Anemia with chronic kidney disease: status post 1 unit PRBC on 1/16. IV venofer on 1/20.  - discontinued NSAIDs   LOS: 5 Courtney Wright 1/21/20181:13 PM

## 2016-05-09 NOTE — Progress Notes (Signed)
Progress Note  Patient Name: Courtney Wright Date of Encounter: 05/09/2016  Primary Cardiologist:   Subjective   Pt awake  Sitting in chair  Denies CP  Breathing is OK    Inpatient Medications    Scheduled Meds: . citalopram  20 mg Oral Daily  . ferrous sulfate  325 mg Oral BID WC  . furosemide  40 mg Oral Daily  . lisinopril  5 mg Oral Daily  . magnesium oxide  400 mg Oral BID  . metoprolol tartrate  12.5 mg Oral BID  . potassium chloride  20 mEq Oral BID   Continuous Infusions:  PRN Meds:  Vital Signs    Vitals:   05/08/16 2350 05/09/16 0328 05/09/16 0500 05/09/16 0718  BP: (!) 162/74 (!) 169/79  (!) 166/52  Pulse: 72 79  80  Resp: 19 18  18   Temp: 98.7 F (37.1 C) 98.3 F (36.8 C)  98.1 F (36.7 C)  TempSrc: Oral Oral  Oral  SpO2: 92% 93%  96%  Weight:   165 lb 12.8 oz (75.2 kg)   Height:        Intake/Output Summary (Last 24 hours) at 05/09/16 0931 Last data filed at 05/09/16 0718  Gross per 24 hour  Intake              270 ml  Output              150 ml  Net              120 ml   Filed Weights   05/07/16 0500 05/08/16 0407 05/09/16 0500  Weight: 165 lb 5 oz (75 kg) 163 lb 1.6 oz (74 kg) 165 lb 12.8 oz (75.2 kg)    Telemetry      ECG      Physical Exam   GEN: No acute distress.  Neck: JVP increased   Cardiac: RRR, no murmurs, rubs, or gallops.  Respiratory: Clear to auscultation bilaterally. GI: Soft, nontender, non-distended  MS: No edema; No deformity. Neuro:  AAOx3. Psych: Normal affect  Labs    Chemistry Recent Labs Lab 05/06/16 1336 05/07/16 0448 05/08/16 0348 05/09/16 0424  NA 128* 131* 134* 135  K  --  4.1 4.7 4.8  CL  --  93* 98* 99*  CO2  --  30 30 30   GLUCOSE  --  83 99 101*  BUN  --  18 25* 26*  CREATININE  --  1.12* 1.29* 1.22*  CALCIUM  --  8.5* 8.5* 8.4*  PROT 6.5  --   --   --   ALBUMIN 3.2*  --   --   --   AST 32  --   --   --   ALT 16  --   --   --   ALKPHOS 58  --   --   --   BILITOT 0.8  --    --   --   GFRNONAA  --  42* 36* 38*  GFRAA  --  49* 41* 44*  ANIONGAP  --  8 6 6      Hematology Recent Labs Lab 05/04/16 1132 05/05/16 0214 05/09/16 0424  WBC 5.4 8.4 5.0  RBC 3.02* 3.51* 3.20*  HGB 7.0* 8.5* 8.0*  HCT 22.0* 26.3* 24.8*  MCV 73.0* 75.1* 77.6*  MCH 23.3* 24.2* 25.0*  MCHC 32.0 32.2 32.2  RDW 16.0* 16.3* 17.4*  PLT 232 256 251    Cardiac Enzymes Recent Labs Lab 05/04/16  1456 05/05/16 0214  TROPONINI 0.20* 0.16*   No results for input(s): TROPIPOC in the last 168 hours.   BNP Recent Labs Lab 05/06/16 1130 05/09/16 0424  BNP 1,509.0* 1,446.0*     DDimer No results for input(s): DDIMER in the last 168 hours.   Radiology    No results found.  Cardiac Studies     Patient Profile     83 y.o. female CKD stage III, iron deficiency anemia on long standing po iron therapy, hypertensive heart disease, depression, and morbid obesity who [facility] after suffering a mechanical fall and was found to have acute respiratory distress with hypoxia in the setting of acute combined CHF, acute on chronic anemia as above requiring 1 unit pRBC, possible OHS, and deconditioning.    Assessment & Plan    1.  Acute on chronic systolic CHF  Pt on oral lasix  Volume is increased but pt rel comfortable  Cr is a little better than yesterday  I would keep on same meds    2 elevated troponin  Reviewed again with family and pt  They would favor a conservative course with medical Rx only   3  Neuro  MS improved    4  HTN  BP is up  Switch to coreg  Titrate  Not eager to increase lisinopril give renal function   Follow    Signed, Dietrich PatesPaula Tianni Escamilla, MD  05/09/2016, 9:31 AM

## 2016-05-09 NOTE — Progress Notes (Signed)
Occupational Therapy Treatment Patient Details Name: Jeronimo GreavesBettie R Martell MRN: [id number] DOB: 03/12/1925 Today's Date: 05/09/2016    History of present illness Patient is a pleasant 83 y/o female that presents after falling out of bed, sustaining bruising but no fractures over L shoulder, R hip, L and R elbows. She has received 1 unit of blood while admitted.    OT comments  Pt seen this date for self care tasks, functional transfers and mobility.  Supine to sit with min assist, sit to stand and [facility] with [facility] assist, min guard to perform toilet hygiene in standing.  Patient requires cues for transfers, pain in left shoulder and has difficulty repositioning herself.  Patient transferred to chair with min guard from toilet but required assist for repositioning in chair.  Requires increased assistance with lower body self care tasks.  Continue to work towards goals to improve independence in basic self care tasks.  Recommend STR prior to returning home.   Follow Up Recommendations  SNF    Equipment Recommendations  Tub/shower seat    Recommendations for Other Services      Precautions / Restrictions Precautions Precautions: Fall Restrictions Weight Bearing Restrictions: No       Mobility Bed Mobility Overal bed mobility: Needs Assistance Bed Mobility: Supine to Sit;Sit to Supine     Supine to sit: Min assist        Transfers Overall transfer level: Needs assistance Equipment used: Rolling walker (2 wheeled) Transfers: Sit to/from Stand Sit to Stand: Min assist              Balance                                   ADL Overall ADL's : Needs assistance/impaired     Grooming: Set up;Sitting           Upper Body Dressing : Set up;Sitting;Supervision/safety;Cueing for compensatory techniques       Toilet Transfer: Minimal assistance;BSC Toilet Transfer Details (indicate cue type and reason): with cuing for hand placement  Toileting-  Clothing Manipulation and Hygiene: Minimal assistance;Sit to/from stand       Functional mobility during ADLs: Publishing rights managerMin guard;Rolling walker        Vision                     Perception     Praxis      Cognition   Behavior During Therapy: WFL for tasks assessed/performed Overall Cognitive Status: Within Functional Limits for tasks assessed                       Extremity/Trunk Assessment               Exercises     Shoulder Instructions       General Comments      Pertinent Vitals/ Pain       Pain Assessment: 0-10 Pain Score: 6  Pain Location: R shoulder, L elbow Pain Descriptors / Indicators: Aching;Sore Pain Intervention(s): Limited activity within patient's tolerance;Monitored during session  Home Living                                          Prior Functioning/Environment  Frequency  Min 1X/week        Progress Toward Goals  OT Goals(current goals can now be found in the care plan section)  Progress towards OT goals: Progressing toward goals  Acute Rehab OT Goals Patient Stated Goal: get better OT Goal Formulation: With patient Time For Goal Achievement: 05/20/16 Potential to Achieve Goals: Good  Plan Discharge plan remains appropriate    Co-evaluation                 End of Session Equipment Utilized During Treatment: Gait belt;Rolling walker   Activity Tolerance Patient tolerated treatment well   Patient Left with call bell/phone within reach;with chair alarm set;with family/visitor present;in chair   Nurse Communication          Time: 9604-5409 OT Time Calculation (min): 29 min  Charges: OT General Charges $OT Visit: 1 Procedure OT Treatments $Self Care/Home Management : 23-37 mins  Lakindra Wible  Jasiyah Poland T Drucella Karbowski, OTR/L, CLT  05/09/2016, 11:58 AM

## 2016-05-10 LAB — BASIC METABOLIC PANEL
ANION GAP: 5 (ref 5–15)
BUN: 20 mg/dL (ref 6–20)
CO2: 32 mmol/L (ref 22–32)
Calcium: 8.6 mg/dL — ABNORMAL LOW (ref 8.9–10.3)
Chloride: 98 mmol/L — ABNORMAL LOW (ref 101–111)
Creatinine, Ser: 0.97 mg/dL (ref 0.44–1.00)
GFR calc Af Amer: 58 mL/min — ABNORMAL LOW (ref >60)
GFR, EST NON AFRICAN AMERICAN: 50 mL/min — AB (ref >60)
Glucose, Bld: 94 mg/dL (ref 65–99)
POTASSIUM: 4.2 mmol/L (ref 3.5–5.1)
SODIUM: 135 mmol/L (ref 135–145)

## 2016-05-10 LAB — HEMOGLOBIN: HEMOGLOBIN: 8.4 g/dL — AB (ref 12.0–16.0)

## 2016-05-10 MED ORDER — SENNOSIDES-DOCUSATE SODIUM 8.6-50 MG PO TABS: 1.0000 | ORAL_TABLET | Freq: Every evening | ORAL | 0 refills | Status: AC | PRN

## 2016-05-10 MED ORDER — FERROUS SULFATE 325 (65 FE) MG PO TABS: 325.0000 mg | ORAL_TABLET | Freq: Two times a day (BID) | ORAL | 0 refills | Status: AC

## 2016-05-10 MED ORDER — QUETIAPINE FUMARATE 25 MG PO TABS: 12.5000 mg | ORAL_TABLET | Freq: Every evening | ORAL | 0 refills | Status: AC | PRN

## 2016-05-10 MED ORDER — TRAMADOL HCL 50 MG PO TABS: 50.0000 mg | ORAL_TABLET | Freq: Three times a day (TID) | ORAL | 0 refills | Status: AC | PRN

## 2016-05-10 MED ORDER — MAGNESIUM OXIDE 400 (241.3 MG) MG PO TABS: 400.0000 mg | ORAL_TABLET | Freq: Every day | ORAL | 0 refills | Status: AC

## 2016-05-10 MED ORDER — CARVEDILOL 12.5 MG PO TABS: 12.5000 mg | ORAL_TABLET | Freq: Two times a day (BID) | ORAL | 0 refills | Status: AC

## 2016-05-10 MED ORDER — LISINOPRIL 20 MG PO TABS: 20.0000 mg | ORAL_TABLET | Freq: Every day | ORAL | 0 refills | Status: AC

## 2016-05-10 NOTE — Progress Notes (Signed)
Patient is medically stable for D/C to [facility]. [facility] to room [facility]B. RN will call report at [phone] and arrange EMS for transport. Clinical Child psychotherapistocial Worker (CSW) sent D/C orders to [employer] via [employer]. Patient is aware of above. Patient's daughter Larita FifeLynn is at bedside and aware of D/C today. Please reconsult if future social work needs arise. CSW signing off.   [employer] Hughes IncorporatedBailey Arlo Butt, LCSW [phone]

## 2016-05-10 NOTE — Discharge Summary (Signed)
[facility] -  at [facility]   PATIENT NAME: Courtney Wright    MR#:  [id number]  DATE OF BIRTH:  05/15/1924  DATE OF ADMISSION:  05/04/2016 ADMITTING PHYSICIAN: Adrian SaranSital Mody, MD  DATE OF DISCHARGE: 05/10/2016  PRIMARY CARE PHYSICIAN: Lorie PhenixNancy Maloney, MD    ADMISSION DIAGNOSIS:  Iron deficiency anemia, unspecified iron deficiency anemia type [D50.9]  DISCHARGE DIAGNOSIS:  Principal Problem:   Acute respiratory distress Active Problems:   CKD (chronic kidney disease), stage III   Iron deficiency anemia   Acute combined systolic and diastolic CHF, NYHA class 2 (HCC)   Hypertensive heart disease   Hyponatremia   Hypokalemia   Hypomagnesemia   Elevated troponin   Pleural effusion   Gait instability   Dementia without behavioral disturbance   SECONDARY DIAGNOSIS:   Past Medical History:  Diagnosis Date  . Accident due to mechanical fall without injury   . Acute combined systolic and diastolic CHF, NYHA class 2 (HCC)    a. echo 05/05/16: EF 35-40%, RWMA cannot be excluded, GR1DD, mild MR, LA mildly dilated, RV sys fxan nl, dilated IVC, PASP 74 mmHg  . CKD (chronic kidney disease), stage III   . Depression   . Hypertensive heart disease   . Iron deficiency anemia   . Malnutrition (HCC)   . Morbid obesity (HCC)   . Schizophrenia (HCC)     HOSPITAL COURSE:   1. Acute on chronic iron deficiency anemia. The patient received 1 unit of packed red blood cells during the hospital course. I did give IV Venofer during the hospital course. Today's hemoglobin is 8.4. Patient deferred transfusion yesterday on a hemoglobin of 8.0. Continue ferrous sulfate as outpatient. Check hemoglobin on a weekly to biweekly basis as outpatient. 2. Acute hypoxic respiratory failure this has resolved and patient is on room air. 3. Acute systolic congestive heart failure with cardiomyopathy. Continue oral Lasix, Coreg and lisinopril. Patient was diuresed with IV Lasix during the  hospital course. Seen by cardiology.  Needs follow-up with cardiology as outpatient. Check a BMP on a weekly to biweekly basis also 4. Elevated troponin secondary to demand ischemia from CHF. 5. Accelerated hypertension. Blood pressure very variable secondary to pain with the blood pressure cuff taking her blood pressure. Adjustments in medications made yesterday. 6. Depression on Celexa 7. Edema lower extremities has improved a little bit with Lasix 8. Weakness. Physical therapy recommended rehabilitation 9. Insomnia while here in the hospital. I gave a big dose of Seroquel last night of 25 mg and she is a little groggy this morning but following commands with moving all extremities and answering questions. This can commonly happen with this medication and usually wears off late morning. I will leave this as needed only at at rehabilitation and lower the dose to 12.5 mg when necessary at night here at 10. Hyponatremia improved 11. Chronic kidney disease stage III 12. Patient is a DO NOT RESUSCITATE  DISCHARGE CONDITIONS:   Satisfactory  CONSULTS OBTAINED:  Treatment Team:  Lamont DowdySarath Kolluru, MD Antonieta Ibaimothy J Gollan, MD  DRUG ALLERGIES:   Allergies  Allergen Reactions  . Anaprox  [Naproxen]   . Fenoprofen Calcium     DISCHARGE MEDICATIONS:   Current Discharge Medication List    START taking these medications   Details  carvedilol (COREG) 12.5 MG tablet Take 1 tablet (12.5 mg total) by mouth 2 (two) times daily with a meal. Qty: 60 tablet, Refills: 0    ferrous sulfate 325 (65 FE) MG tablet  Take 1 tablet (325 mg total) by mouth 2 (two) times daily with a meal. Qty: 60 tablet, Refills: 0    lisinopril (PRINIVIL,ZESTRIL) 20 MG tablet Take 1 tablet (20 mg total) by mouth daily. Qty: 30 tablet, Refills: 0    magnesium oxide (MAG-OX) 400 (241.3 Mg) MG tablet Take 1 tablet (400 mg total) by mouth daily. Qty: 30 tablet, Refills: 0    QUEtiapine (SEROQUEL) 25 MG tablet Take 0.5 tablets  (12.5 mg total) by mouth at bedtime as needed (for insomnia). Qty: 15 tablet, Refills: 0    senna-docusate (SENOKOT-S) 8.6-50 MG tablet Take 1 tablet by mouth at bedtime as needed for mild constipation. Qty: 30 tablet, Refills: 0      CONTINUE these medications which have CHANGED   Details  traMADol (ULTRAM) 50 MG tablet Take 1 tablet (50 mg total) by mouth every 8 (eight) hours as needed. Qty: 20 tablet, Refills: 0   Associated Diagnoses: Arthralgia of both knees      CONTINUE these medications which have NOT CHANGED   Details  acetaminophen (TYLENOL) 500 MG tablet Take 500 mg by mouth every 6 (six) hours as needed for mild pain or moderate pain.    citalopram (CELEXA) 20 MG tablet TAKE ONE (1) TABLET BY MOUTH EVERY DAY Qty: 90 tablet, Refills: 1   Associated Diagnoses: Anxiety    furosemide (LASIX) 40 MG tablet Take 1 tablet (40 mg total) by mouth daily. Qty: 30 tablet, Refills: 5   Associated Diagnoses: Bilateral edema of lower extremity      STOP taking these medications     ibuprofen (ADVIL,MOTRIN) 200 MG tablet      diclofenac sodium (VOLTAREN) 1 % GEL      oxyCODONE-acetaminophen (PERCOCET) 10-325 MG tablet          DISCHARGE INSTRUCTIONS:   Follow-up with Dr. rehabilitation 1 day Follow-up with cardiology 1 week  If you experience worsening of your admission symptoms, develop shortness of breath, life threatening emergency, suicidal or homicidal thoughts you must seek medical attention immediately by calling 911 or calling your MD immediately  if symptoms less severe.  You Must read complete instructions/literature along with all the possible adverse reactions/side effects for all the Medicines you take and that have been prescribed to you. Take any new Medicines after you have completely understood and accept all the possible adverse reactions/side effects.   Please note  You were cared for by a hospitalist during your hospital stay. If you have any  questions about your discharge medications or the care you received while you were in the hospital after you are discharged, you can call the unit and asked to speak with the hospitalist on call if the hospitalist that took care of you is not available. Once you are discharged, your primary care physician will handle any further medical issues. Please note that NO REFILLS for any discharge medications will be authorized once you are discharged, as it is imperative that you return to your primary care physician (or establish a relationship with a primary care physician if you do not have one) for your aftercare needs so that they can reassess your need for medications and monitor your lab values.    Today   CHIEF COMPLAINT:   Chief Complaint  Patient presents with  . Fall  . Arm Pain  . Shoulder Injury    HISTORY OF PRESENT ILLNESS:  Courtney Wright  is a 83 y.o. female presented with fall and some shoulder pain. Has  not been doing well for couple months with cough and some shortness of breath   VITAL SIGNS:  Blood pressure (!) 160/60, pulse 81, temperature 98.6 F (37 C), temperature source Oral, resp. rate 19, height 5\' 4"  (1.626 m), weight 73.6 kg (162 lb 4.8 oz), SpO2 99 %.    PHYSICAL EXAMINATION:  GENERAL:  83 y.o.-year-old patient lying in the bed with no acute distress.  EYES: Pupils equal, round, reactive to light and accommodation. No scleral icterus. Extraocular muscles intact.  HEENT: Head atraumatic, normocephalic. Oropharynx and nasopharynx clear.  NECK:  Supple, no jugular venous distention. No thyroid enlargement, no tenderness.  LUNGS: Normal breath sounds bilaterally, no wheezing, rales,rhonchi or crepitation. No use of accessory muscles of respiration.  CARDIOVASCULAR: S1, S2 normal. No murmurs, rubs, or gallops.  ABDOMEN: Soft, non-tender, non-distended. Bowel sounds present. No organomegaly or mass.  EXTREMITIES: 2+ edema, no cyanosis, or clubbing.  NEUROLOGIC:  Cranial nerves II through XII are intact. Muscle strength 5/5 in all extremities. Sensation intact. Gait not checked.  PSYCHIATRIC: The patient is alert and answers questions.  SKIN: Bruising both arms   DATA REVIEW:   CBC  Recent Labs Lab 05/09/16 0424 05/10/16 0842  WBC 5.0  --   HGB 8.0* 8.4*  HCT 24.8*  --   PLT 251  --     Chemistries   Recent Labs Lab 05/06/16 1336 05/07/16 0448  05/10/16 0431  NA 128* 131*  < > 135  K  --  4.1  < > 4.2  CL  --  93*  < > 98*  CO2  --  30  < > 32  GLUCOSE  --  83  < > 94  BUN  --  18  < > 20  CREATININE  --  1.12*  < > 0.97  CALCIUM  --  8.5*  < > 8.6*  MG  --  2.2  --   --   AST 32  --   --   --   ALT 16  --   --   --   ALKPHOS 58  --   --   --   BILITOT 0.8  --   --   --   < > = values in this interval not displayed.  Cardiac Enzymes  Recent Labs Lab 05/05/16 0214  TROPONINI 0.16*    Microbiology Results  Results for orders placed or performed during the hospital encounter of 05/04/16  Urine culture     Status: Abnormal   Collection Time: 05/04/16  2:08 PM  Result Value Ref Range Status   Specimen Description URINE, RANDOM  Final   Special Requests NONE  Final   Culture MULTIPLE SPECIES PRESENT, SUGGEST RECOLLECTION (A)  Final   Report Status 05/05/2016 FINAL  Final      Management plans discussed with the patient, family and they are in agreement.  CODE STATUS:     Code Status Orders        Start     Ordered   05/04/16 1443  Do not attempt resuscitation (DNR)  Continuous    Question Answer Comment  In the event of cardiac or respiratory ARREST Do not call a "code blue"   In the event of cardiac or respiratory ARREST Do not perform Intubation, CPR, defibrillation or ACLS   In the event of cardiac or respiratory ARREST Use medication by any route, position, wound care, and other measures to relive pain and suffering. May use oxygen, suction  and manual treatment of airway obstruction as needed for  comfort.      05/04/16 1442    Code Status History    Date Active Date Inactive Code Status Order ID Comments User Context   This patient has a current code status but no historical code status.    Advance Directive Documentation   Flowsheet Row Most Recent Value  Type of Advance Directive  Healthcare Power of Attorney, Living will  Pre-existing out of facility DNR order (yellow form or pink MOST form)  No data  "MOST" Form in Place?  No data      TOTAL TIME TAKING CARE OF THIS PATIENT: 35 minutes.    Alford Highland M.D on 05/10/2016 at 9:34 AM  Between 7am to 6pm - Pager - [phone]  After 6pm go to [email or web address] - password [email or web address]  [facility] Office  [phone]  CC: Primary care physician; Lorie Phenix, MD

## 2016-05-10 NOTE — Progress Notes (Signed)
[facility] called [facility].

## 2016-05-10 NOTE — Clinical Social Work Placement (Signed)
   CLINICAL SOCIAL WORK PLACEMENT  NOTE  Date:  05/10/2016  Patient Details  Name: Courtney GreavesBettie R Mcmahen MRN: [id number] Date of Birth: 09/15/1924  Clinical Social Work is seeking post-discharge placement for this patient at the Skilled  Nursing Facility level of care (*CSW will initial, date and re-position this form in  chart as items are completed):  Yes   Patient/family provided with [place] Clinical Social Work Department's list of facilities offering this level of care within the geographic area requested by the patient (or if unable, by the patient's family).  Yes   Patient/family informed of their freedom to choose among providers that offer the needed level of care, that participate in Medicare, Medicaid or managed care program needed by the patient, have an available bed and are willing to accept the patient.  Yes   Patient/family informed of [facility]'s ownership interest in [facility] and [facility], as well as of the fact that they are under no obligation to receive care at these facilities.  PASRR submitted to EDS on  (PASARR could not be sumbitted or looked up. PASARR is closed now. )     PASRR number received on       Existing PASRR number confirmed on 05/07/16 (PASARR was confirmed on Friday 05/07/16)     FL2 transmitted to all facilities in geographic area requested by pt/family on 05/05/16     FL2 transmitted to all facilities within larger geographic area on       Patient informed that his/her managed care company has contracts with or will negotiate with certain facilities, including the following:        Yes   Patient/family informed of bed offers received.  Patient chooses bed at  [facility] )     Physician recommends and patient chooses bed at      Patient to be transferred to  [facility] ) on 05/10/16.  Patient to be transferred to facility by  [facility] )     Patient family notified on 05/10/16 of transfer.  Name of  family member notified:   (Patient's daughter Larita FifeLynn is at bedside and aware of D/C today. )     PHYSICIAN       Additional Comment:    _______________________________________________ Shanti Eichel, Darleen CrockerBailey M, LCSW 05/10/2016, 1:16 PM

## 2016-05-14 LAB — CBC WITH DIFFERENTIAL/PLATELET
Basophils Absolute: 0 10*3/uL (ref 0–0.1)
Basophils Relative: 1 %
EOS PCT: 1 %
Eosinophils Absolute: 0.1 10*3/uL (ref 0–0.7)
HCT: 24.7 % — ABNORMAL LOW (ref 35.0–47.0)
Hemoglobin: 8.2 g/dL — ABNORMAL LOW (ref 12.0–16.0)
LYMPHS ABS: 1.2 10*3/uL (ref 1.0–3.6)
LYMPHS PCT: 27 %
MCH: 25.8 pg — AB (ref 26.0–34.0)
MCHC: 33 g/dL (ref 32.0–36.0)
MCV: 78.1 fL — ABNORMAL LOW (ref 80.0–100.0)
MONO ABS: 0.9 10*3/uL (ref 0.2–0.9)
Monocytes Relative: 19 %
Neutro Abs: 2.5 10*3/uL (ref 1.4–6.5)
Neutrophils Relative %: 52 %
PLATELETS: 271 10*3/uL (ref 150–440)
RBC: 3.16 MIL/uL — AB (ref 3.80–5.20)
RDW: 19.3 % — ABNORMAL HIGH (ref 11.5–14.5)
WBC: 4.7 10*3/uL (ref 3.6–11.0)

## 2016-05-17 LAB — COMPREHENSIVE METABOLIC PANEL
ALT: 11 U/L — ABNORMAL LOW (ref 14–54)
ANION GAP: 6 (ref 5–15)
AST: 23 U/L (ref 15–41)
Albumin: 2.9 g/dL — ABNORMAL LOW (ref 3.5–5.0)
Alkaline Phosphatase: 43 U/L (ref 38–126)
BILIRUBIN TOTAL: 0.5 mg/dL (ref 0.3–1.2)
BUN: 31 mg/dL — ABNORMAL HIGH (ref 6–20)
CO2: 28 mmol/L (ref 22–32)
Calcium: 8.5 mg/dL — ABNORMAL LOW (ref 8.9–10.3)
Chloride: 100 mmol/L — ABNORMAL LOW (ref 101–111)
Creatinine, Ser: 1.26 mg/dL — ABNORMAL HIGH (ref 0.44–1.00)
GFR calc Af Amer: 42 mL/min — ABNORMAL LOW (ref >60)
GFR calc non Af Amer: 37 mL/min — ABNORMAL LOW (ref >60)
GLUCOSE: 90 mg/dL (ref 65–99)
POTASSIUM: 3.6 mmol/L (ref 3.5–5.1)
Sodium: 134 mmol/L — ABNORMAL LOW (ref 135–145)
TOTAL PROTEIN: 6.2 g/dL — AB (ref 6.5–8.1)

## 2016-05-17 LAB — CBC WITH DIFFERENTIAL/PLATELET
BASOS ABS: 0 10*3/uL (ref 0–0.1)
Basophils Relative: 1 %
Eosinophils Absolute: 0.1 10*3/uL (ref 0–0.7)
Eosinophils Relative: 2 %
HEMATOCRIT: 26.8 % — AB (ref 35.0–47.0)
Hemoglobin: 8.9 g/dL — ABNORMAL LOW (ref 12.0–16.0)
LYMPHS PCT: 20 %
Lymphs Abs: 1.1 10*3/uL (ref 1.0–3.6)
MCH: 26 pg (ref 26.0–34.0)
MCHC: 33.2 g/dL (ref 32.0–36.0)
MCV: 78.5 fL — AB (ref 80.0–100.0)
MONO ABS: 1.1 10*3/uL — AB (ref 0.2–0.9)
Monocytes Relative: 20 %
NEUTROS ABS: 3.2 10*3/uL (ref 1.4–6.5)
Neutrophils Relative %: 57 %
Platelets: 243 10*3/uL (ref 150–440)
RBC: 3.42 MIL/uL — AB (ref 3.80–5.20)
RDW: 23.1 % — ABNORMAL HIGH (ref 11.5–14.5)
WBC: 5.6 10*3/uL (ref 3.6–11.0)

## 2016-05-20 ENCOUNTER — Encounter
Admission: RE | Admit: 2016-05-20 | Discharge: 2016-05-20 | Disposition: A | Discharge status: Home / Self Care | Payer: Medicare Other | Source: Ambulatory Visit | Attending: Internal Medicine | Admitting: Internal Medicine

## 2016-05-21 ENCOUNTER — Ambulatory Visit: Payer: Self-pay | Admitting: Internal Medicine

## 2016-05-25 ENCOUNTER — Ambulatory Visit: Payer: Medicare Other | Admitting: Internal Medicine

## 2016-05-28 LAB — GLUCOSE, CAPILLARY
GLUCOSE-CAPILLARY: 98 mg/dL (ref 65–99)
Glucose-Capillary: 164 mg/dL — ABNORMAL HIGH (ref 65–99)

## 2016-05-31 ENCOUNTER — Inpatient Hospital Stay: Payer: Self-pay | Admitting: Physician Assistant

## 2016-05-31 LAB — MAGNESIUM: MAGNESIUM: 2.7 mg/dL — AB (ref 1.7–2.4)

## 2016-05-31 LAB — COMPREHENSIVE METABOLIC PANEL
ALBUMIN: 3.9 g/dL (ref 3.5–5.0)
ALT: 14 U/L (ref 14–54)
AST: 29 U/L (ref 15–41)
Alkaline Phosphatase: 58 U/L (ref 38–126)
Anion gap: 9 (ref 5–15)
BILIRUBIN TOTAL: 0.4 mg/dL (ref 0.3–1.2)
BUN: 56 mg/dL — AB (ref 6–20)
CHLORIDE: 102 mmol/L (ref 101–111)
CO2: 25 mmol/L (ref 22–32)
Calcium: 9.3 mg/dL (ref 8.9–10.3)
Creatinine, Ser: 2.07 mg/dL — ABNORMAL HIGH (ref 0.44–1.00)
GFR calc Af Amer: 23 mL/min — ABNORMAL LOW (ref >60)
GFR calc non Af Amer: 20 mL/min — ABNORMAL LOW (ref >60)
GLUCOSE: 133 mg/dL — AB (ref 65–99)
Potassium: 4.2 mmol/L (ref 3.5–5.1)
Sodium: 136 mmol/L (ref 135–145)
Total Protein: 8.1 g/dL (ref 6.5–8.1)

## 2016-05-31 LAB — CBC WITH DIFFERENTIAL/PLATELET
BASOS ABS: 0 10*3/uL (ref 0–0.1)
Basophils Relative: 1 %
Eosinophils Absolute: 0.1 10*3/uL (ref 0–0.7)
Eosinophils Relative: 1 %
HCT: 33 % — ABNORMAL LOW (ref 35.0–47.0)
HEMOGLOBIN: 10.6 g/dL — AB (ref 12.0–16.0)
LYMPHS PCT: 25 %
Lymphs Abs: 1.3 10*3/uL (ref 1.0–3.6)
MCH: 25.9 pg — ABNORMAL LOW (ref 26.0–34.0)
MCHC: 32.3 g/dL (ref 32.0–36.0)
MCV: 80.2 fL (ref 80.0–100.0)
Monocytes Absolute: 0.6 10*3/uL (ref 0.2–0.9)
Monocytes Relative: 12 %
NEUTROS PCT: 61 %
Neutro Abs: 3 10*3/uL (ref 1.4–6.5)
PLATELETS: 239 10*3/uL (ref 150–440)
RBC: 4.11 MIL/uL (ref 3.80–5.20)
RDW: 23.2 % — ABNORMAL HIGH (ref 11.5–14.5)
WBC: 5 10*3/uL (ref 3.6–11.0)

## 2016-05-31 LAB — INFLUENZA PANEL BY PCR (TYPE A & B)
Influenza A By PCR: NEGATIVE
Influenza B By PCR: NEGATIVE

## 2016-05-31 LAB — VITAMIN B12: Vitamin B-12: 484 pg/mL (ref 180–914)

## 2016-06-01 ENCOUNTER — Non-Acute Institutional Stay (SKILLED_NURSING_FACILITY): Payer: Medicare Other | Admitting: Gerontology

## 2016-06-01 DIAGNOSIS — E559 Vitamin D deficiency, unspecified: Secondary | ICD-10-CM

## 2016-06-01 DIAGNOSIS — N183 Chronic kidney disease, stage 3 unspecified: Secondary | ICD-10-CM

## 2016-06-01 DIAGNOSIS — E538 Deficiency of other specified B group vitamins: Secondary | ICD-10-CM | POA: Diagnosis not present

## 2016-06-01 DIAGNOSIS — R1013 Epigastric pain: Secondary | ICD-10-CM | POA: Diagnosis not present

## 2016-06-01 LAB — VITAMIN D 25 HYDROXY (VIT D DEFICIENCY, FRACTURES): Vit D, 25-Hydroxy: 10 ng/mL — ABNORMAL LOW (ref 30.0–100.0)

## 2016-06-01 NOTE — Progress Notes (Signed)
Location:      Place of Service:  SNF (31) Provider:  Toni Arthurs, NP-C  Margarita Rana, MD  Patient Care Team: Margarita Rana, MD as PCP - [facility])  Extended Emergency Contact Information Primary Emergency Contact: [facility] Address: [address]           [place], [place] [address] [name] Phone: [phone] Relation: Daughter  Code Status:  dnr Goals of care: Advanced Directive information Advanced Directives 05/04/2016  Does Patient Have a Medical Advance Directive? Yes  Type of Paramedic of [place];Living will  Does patient want to make changes to medical advance directive? No - Patient declined  Copy of [place] in Chart? No - copy requested     Chief Complaint  Patient presents with  . Acute Visit    HPI:  Pt is a 83 y.o. female seen today for an acute visit for intractable nausea and vomiting with diffuse epigastric pain. Pt describes the epigastric pain as sharp, grabbing. Intermittent. Pt reports she feels like she has to have a BM, but unable. Abdomen is soft at rest, but taut with guarding with light palpation. Pt is currently sitting on the toilet attempting to have a BM. She is also coughing and vomiting up clear/white sputum. Pt is unable to clearly identify a specific area of pain. Pt denies diarrhea, fever, chills, chest pain, shortness of breath, headache, dizziness. Appetite poor. Generalized malaise. KUB obtained. No acute findings. VSS.    Past Medical History:  Diagnosis Date  . Accident due to mechanical fall without injury   . Acute combined systolic and diastolic CHF, NYHA class 2 ([place])    a. echo 05/05/16: EF 35-40%, RWMA cannot be excluded, GR1DD, mild MR, LA mildly dilated, RV sys fxan nl, dilated IVC, PASP 74 mmHg  . CKD (chronic kidney disease), stage III   . Depression   . Hypertensive heart disease   . Iron deficiency anemia   . Malnutrition ([place])   .  Morbid obesity ([place])   . Schizophrenia [facility])    Past Surgical History:  Procedure Laterality Date  . ANKLE SURGERY Left   . History of Cataract surgery  2008  . History of cervical discectomy  01/10/2008   Dr. Arnoldo Morale, [facility]; Fusion and Plating at C4-5 and 5-6    Allergies  Allergen Reactions  . Anaprox  [Naproxen]   . Fenoprofen Calcium     Allergies as of 06/01/2016      Reactions   Anaprox  [naproxen]    Fenoprofen Calcium       Medication List       Accurate as of 06/01/16  8:47 PM. Always use your most recent med list.          acetaminophen 500 MG tablet Commonly known as:  TYLENOL Take 500 mg by mouth every 6 (six) hours as needed for mild pain or moderate pain.   carvedilol 12.5 MG tablet Commonly known as:  COREG Take 1 tablet (12.5 mg total) by mouth 2 (two) times daily with a meal.   citalopram 20 MG tablet Commonly known as:  CELEXA TAKE ONE (1) TABLET BY MOUTH EVERY DAY   ferrous sulfate 325 (65 FE) MG tablet Take 1 tablet (325 mg total) by mouth 2 (two) times daily with a meal.   furosemide 40 MG tablet Commonly known as:  LASIX Take 1 tablet (40 mg total) by mouth daily.   lisinopril 20 MG  tablet Commonly known as:  PRINIVIL,ZESTRIL Take 1 tablet (20 mg total) by mouth daily.   magnesium oxide 400 (241.3 Mg) MG tablet Commonly known as:  MAG-OX Take 1 tablet (400 mg total) by mouth daily.   QUEtiapine 25 MG tablet Commonly known as:  SEROQUEL Take 0.5 tablets (12.5 mg total) by mouth at bedtime as needed (for insomnia).   senna-docusate 8.6-50 MG tablet Commonly known as:  Senokot-S Take 1 tablet by mouth at bedtime as needed for mild constipation.   traMADol 50 MG tablet Commonly known as:  ULTRAM Take 1 tablet (50 mg total) by mouth every 8 (eight) hours as needed.       Review of Systems  Constitutional: Negative for activity change, appetite change, chills, diaphoresis and fever.  HENT: Negative for congestion, sneezing,  sore throat, trouble swallowing and voice change.   Respiratory: Negative for apnea, cough, choking, chest tightness, shortness of breath and wheezing.   Cardiovascular: Negative for chest pain, palpitations and leg swelling.  Gastrointestinal: Positive for abdominal pain, nausea and vomiting. Negative for abdominal distention, constipation and diarrhea.  Genitourinary: Negative for difficulty urinating, dysuria, frequency and urgency.  Musculoskeletal: Negative for back pain, gait problem and myalgias. Arthralgias: typical arthritis.  Skin: Negative for color change, pallor, rash and wound.  Neurological: Negative for dizziness, tremors, syncope, speech difficulty, weakness, numbness and headaches.  Psychiatric/Behavioral: Negative for agitation and behavioral problems.  All other systems reviewed and are negative.   Immunization History  Administered Date(s) Administered  . Influenza,inj,Quad PF,36+ Mos 05/10/2016  . Pneumococcal Conjugate-13 12/03/2013  . Pneumococcal Polysaccharide-23 02/19/1994  . Td 05/12/2006   Pertinent  Health Maintenance Due  Topic Date Due  . INFLUENZA VACCINE  Completed  . DEXA SCAN  Completed  . PNA vac Low Risk Adult  Completed   No flowsheet data found. Functional Status Survey:    Vitals:   05/31/16 0530  BP: 116/68  Pulse: (!) 59  Resp: 18  Temp: 97.9 F (36.6 C)  SpO2: 95%  Weight: 148 lb 4.8 oz (67.3 kg)   Body mass index is 25.46 kg/m. Physical Exam  Constitutional: She is oriented to person, place, and time. Vital signs are normal. She appears well-developed and well-nourished. She is active and cooperative. She does not appear ill. No distress.  HENT:  Head: Normocephalic and atraumatic.  Mouth/Throat: Uvula is midline, oropharynx is clear and moist and mucous membranes are normal. Mucous membranes are not pale, not dry and not cyanotic.  Eyes: Conjunctivae, EOM and lids are normal. Pupils are equal, round, and reactive to light.    Neck: Trachea normal, normal range of motion and full passive range of motion without pain. Neck supple. No JVD present. No tracheal deviation, no edema and no erythema present. No thyromegaly present.  Cardiovascular: Normal rate, regular rhythm, normal heart sounds, intact distal pulses and normal pulses.  Exam reveals no gallop, no distant heart sounds and no friction rub.   No murmur heard. Pulmonary/Chest: Effort normal and breath sounds normal. No accessory muscle usage. No respiratory distress. She has no decreased breath sounds. She has no wheezes. She has no rhonchi. She has no rales. She exhibits no tenderness.  Abdominal: Soft. Normal appearance and bowel sounds are normal. She exhibits no distension and no ascites. There is tenderness in the epigastric area. There is guarding. There is no CVA tenderness.  Musculoskeletal: Normal range of motion. She exhibits no edema or tenderness.  Expected osteoarthritis, stiffness  Neurological: She is alert  and oriented to person, place, and time. She has normal strength.  Skin: Skin is warm, dry and intact. No rash noted. She is not diaphoretic. No cyanosis or erythema. No pallor. Nails show no clubbing.  Psychiatric: She has a normal mood and affect. Her speech is normal and behavior is normal. Judgment and thought content normal. Cognition and memory are normal.  Nursing note and vitals reviewed.   Labs reviewed:  Recent Labs  05/06/16 0834  05/07/16 0448  05/10/16 0431 05/17/16 0455 05/31/16 1444  NA 128*  < > 131*  < > 135 134* 136  K  --   --  4.1  < > 4.2 3.6 4.2  CL  --   --  93*  < > 98* 100* 102  CO2  --   --  30  < > 32 28 25  GLUCOSE  --   --  83  < > 94 90 133*  BUN  --   --  18  < > 20 31* 56*  CREATININE  --   --  1.12*  < > 0.97 1.26* 2.07*  CALCIUM  --   --  8.5*  < > 8.6* 8.5* 9.3  MG 1.2*  --  2.2  --   --   --  2.7*  < > = values in this interval not displayed.  Recent Labs  05/06/16 1336 05/17/16 0455  05/31/16 1444  AST 32 23 29  ALT 16 11* 14  ALKPHOS 58 43 58  BILITOT 0.8 0.5 0.4  PROT 6.5 6.2* 8.1  ALBUMIN 3.2* 2.9* 3.9    Recent Labs  05/14/16 0730 05/17/16 0455 05/31/16 1444  WBC 4.7 5.6 5.0  NEUTROABS 2.5 3.2 3.0  HGB 8.2* 8.9* 10.6*  HCT 24.7* 26.8* 33.0*  MCV 78.1* 78.5* 80.2  PLT 271 243 239   Lab Results  Component Value Date   TSH 0.701 05/04/2016   No results found for: HGBA1C Lab Results  Component Value Date   CHOL 120 05/08/2016   HDL 48 05/08/2016   LDLCALC 60 05/08/2016   TRIG 62 05/08/2016   CHOLHDL 2.5 05/08/2016    Significant Diagnostic Results in last 30 days:  Dg Elbow 2 Views Right  Result Date: 05/04/2016 CLINICAL DATA:  Left elbow pain due to a fall this morning. Initial encounter. EXAM: RIGHT ELBOW - 2 VIEW COMPARISON:  None. FINDINGS: No fracture, dislocation or joint effusion is identified. Soft tissues posterior to the elbow appear mildly swollen. Scattered subcutaneous calcifications posteriorly may be due to phleboliths. IMPRESSION: Negative for fracture dislocation. Possible posterior soft tissue swelling may be due to hematoma. Electronically Signed   By: Inge Rise M.D.   On: 05/04/2016 12:35   US Venous Img Lower Unilateral Right  Result Date: 05/05/2016 CLINICAL DATA:  Right lower extremity edema. EXAM: RIGHT LOWER EXTREMITY VENOUS DOPPLER ULTRASOUND TECHNIQUE: Gray-scale sonography with graded compression, as well as color Doppler and duplex ultrasound were performed to evaluate the lower extremity deep venous systems from the level of the common femoral vein and including the common femoral, femoral, profunda femoral, popliteal and calf veins including the posterior tibial, peroneal and gastrocnemius veins when visible. The superficial great saphenous vein was also interrogated. Spectral Doppler was utilized to evaluate flow at rest and with distal augmentation maneuvers in the common femoral, femoral and popliteal veins.  COMPARISON:  None. FINDINGS: Contralateral Common Femoral Vein: Left common femoral vein is patent without thrombus. Common Femoral Vein: No evidence of  thrombus. Normal compressibility, respiratory phasicity and response to augmentation. Saphenofemoral Junction: No evidence of thrombus. Normal compressibility and flow on color Doppler imaging. Profunda Femoral Vein: No evidence of thrombus. Normal compressibility and flow on color Doppler imaging. Femoral Vein: No evidence of thrombus. Normal compressibility, respiratory phasicity and response to augmentation. Popliteal Vein: No evidence of thrombus. Normal compressibility, respiratory phasicity and response to augmentation. Calf Veins: No evidence of thrombus. Normal compressibility and flow on color Doppler imaging. IMPRESSION: Negative for deep venous thrombosis in right lower extremity. Electronically Signed   By: Markus Daft M.D.   On: 05/05/2016 11:32   Dg Chest Port 1 View  Result Date: 05/05/2016 CLINICAL DATA:  Shortness of breath. EXAM: PORTABLE CHEST 1 VIEW COMPARISON:  01/15/2008 FINDINGS: The heart is enlarged. There is atherosclerosis of the thoracic aorta. Diffuse bilateral reticular opacities with Kerley B-lines consistent with pulmonary edema. Probable left pleural effusion. Left lung base suboptimally assessed due to soft tissue attenuation and portable technique. No confluent airspace disease elsewhere. No pneumothorax. Degenerative change in the spine. IMPRESSION: 1. Cardiomegaly with diffuse reticular opacities most consistent with pulmonary edema. Probable left pleural effusion. Findings suspicious for CHF. 2. Thoracic aortic atherosclerosis. Electronically Signed   By: Jeb Levering M.D.   On: 05/05/2016 05:16   Dg Shoulder Left  Result Date: 05/04/2016 CLINICAL DATA:  Fall this morning. Left shoulder pain. Initial encounter. EXAM: LEFT SHOULDER - 2+ VIEW COMPARISON:  None. FINDINGS: There is no evidence of fracture or dislocation.  Generalized osteopenia noted. High-riding humeral head seen, consistent with chronic rotator cuff tear or atrophy. IMPRESSION: No acute findings. Findings consistent with chronic rotator cuff tear or atrophy. Electronically Signed   By: Earle Gell M.D.   On: 05/04/2016 12:32   Dg Hip Unilat W Or Wo Pelvis 2-3 Views Left  Result Date: 05/04/2016 CLINICAL DATA:  Left hip pain due to a fall this morning. Initial encounter. EXAM: DG HIP (WITH OR WITHOUT PELVIS) 2-3V LEFT COMPARISON:  None. FINDINGS: No acute bony or joint abnormality is identified. No notable degenerative change about the hips. No focal bony lesion. Degenerative change symphysis pubis somewhat lumbar spine noted. Soft tissues are unremarkable. IMPRESSION: No acute abnormality. Electronically Signed   By: Inge Rise M.D.   On: 05/04/2016 12:31   Dg Hip Unilat W Or Wo Pelvis 2-3 Views Right  Result Date: 05/04/2016 CLINICAL DATA:  Bilateral hip pain due to a fall this morning. EXAM: DG HIP (WITH OR WITHOUT PELVIS) 2-3V RIGHT COMPARISON:  None. FINDINGS: No acute bony or joint abnormality is identified. No notable degenerative change about the hip. No focal bony lesion. Mild degenerative change of the symphysis pubis and lower lumbar spondylosis noted. IMPRESSION: No acute abnormality. Electronically Signed   By: Inge Rise M.D.   On: 05/04/2016 12:31      Assessment/Plan 1. Epigastric pain  Bentyl 10 mg po TID  GI Cocktail 30 mL po QID  Scopolamine patch- 1 patch Q 3 days  Zofran 4 mg po Q 6 hours prn  Phenergan 12.5 mg IM Q 4 hrs prn  2. Vitamin B12 deficiency  Cyanocobalamin 2,000 mcg po daily x 14 days, then  Cyanocobalamin 1,000 mcg po daily  3. Vitamin D deficiency  Cholecalciferol 2,000 unit capsules- 3 capsules daily x 12 weeks, then  Cholecalciferol 2,000 unit capsules- 1 capsule daily   Follow up with PCP to monitor  4. CKD Stage IV  0.9% NS 250 mL bolus over 1 hour, then  0.9% NS at 100  ml/hr x 48 hours  Family/ staff Communication:   Total Time:  Documentation:  Face to Face:  Family/Phone:   Labs/tests ordered:  Cbc, met c, tsh, B12, D, mag+, Flu swab, KUB, Abdominal ultrasound  Medication list reviewed and assessed for continued appropriateness.  Vikki Ports, NP-C Geriatrics [facility] [phone], [place] [address] Cell Phone (Mon-Fri 8am-5pm):  [phone] On Call:  [phone] & follow prompts after 5pm & weekends Office Phone:  [phone] Office Fax:  [phone]

## 2016-06-02 LAB — CBC WITH DIFFERENTIAL/PLATELET
Basophils Absolute: 0 10*3/uL (ref 0–0.1)
Basophils Relative: 1 %
EOS ABS: 0.1 10*3/uL (ref 0–0.7)
EOS PCT: 3 %
HCT: 28.5 % — ABNORMAL LOW (ref 35.0–47.0)
Hemoglobin: 9 g/dL — ABNORMAL LOW (ref 12.0–16.0)
LYMPHS ABS: 1.5 10*3/uL (ref 1.0–3.6)
Lymphocytes Relative: 39 %
MCH: 25.7 pg — AB (ref 26.0–34.0)
MCHC: 31.4 g/dL — ABNORMAL LOW (ref 32.0–36.0)
MCV: 81.7 fL (ref 80.0–100.0)
Monocytes Absolute: 0.6 10*3/uL (ref 0.2–0.9)
Monocytes Relative: 17 %
Neutro Abs: 1.5 10*3/uL (ref 1.4–6.5)
Neutrophils Relative %: 40 %
PLATELETS: 191 10*3/uL (ref 150–440)
RBC: 3.49 MIL/uL — ABNORMAL LOW (ref 3.80–5.20)
RDW: 23.2 % — ABNORMAL HIGH (ref 11.5–14.5)
WBC: 3.8 10*3/uL (ref 3.6–11.0)

## 2016-06-02 LAB — COMPREHENSIVE METABOLIC PANEL
ALT: 10 U/L — ABNORMAL LOW (ref 14–54)
ANION GAP: 6 (ref 5–15)
AST: 21 U/L (ref 15–41)
Albumin: 2.7 g/dL — ABNORMAL LOW (ref 3.5–5.0)
Alkaline Phosphatase: 45 U/L (ref 38–126)
BUN: 46 mg/dL — ABNORMAL HIGH (ref 6–20)
CHLORIDE: 110 mmol/L (ref 101–111)
CO2: 22 mmol/L (ref 22–32)
CREATININE: 1.74 mg/dL — AB (ref 0.44–1.00)
Calcium: 8.5 mg/dL — ABNORMAL LOW (ref 8.9–10.3)
GFR calc non Af Amer: 25 mL/min — ABNORMAL LOW (ref >60)
GFR, EST AFRICAN AMERICAN: 29 mL/min — AB (ref >60)
Glucose, Bld: 72 mg/dL (ref 65–99)
Potassium: 4 mmol/L (ref 3.5–5.1)
SODIUM: 138 mmol/L (ref 135–145)
Total Bilirubin: 0.4 mg/dL (ref 0.3–1.2)
Total Protein: 5.8 g/dL — ABNORMAL LOW (ref 6.5–8.1)

## 2016-06-03 ENCOUNTER — Non-Acute Institutional Stay (SKILLED_NURSING_FACILITY): Payer: Medicare Other | Admitting: Gerontology

## 2016-06-03 DIAGNOSIS — N183 Chronic kidney disease, stage 3 unspecified: Secondary | ICD-10-CM

## 2016-06-03 DIAGNOSIS — R1013 Epigastric pain: Secondary | ICD-10-CM

## 2016-06-03 NOTE — Progress Notes (Signed)
Location:      Place of Service:  SNF (31) Provider:  Toni Arthurs, NP-C  Margarita Rana, MD  Patient Care Team: Margarita Rana, MD as PCP - General [facility] Medicine)  Extended Emergency Contact Information Primary Emergency Contact: [facility] Address: [address]           [place], [place] [address] Johnnette Litter of [place] Phone: [phone] Relation: Daughter  Code Status:  dnr Goals of care: Advanced Directive information Advanced Directives 05/04/2016  Does Patient Have a Medical Advance Directive? Yes  Type of Paramedic of [place];Living will  Does patient want to make changes to medical advance directive? No - Patient declined  Copy of [place] in Chart? No - copy requested     Chief Complaint  Patient presents with  . Follow-up    HPI:  Pt is a 83 y.o. female seen today for a follow up visit for intractable nausea and vomiting with diffuse epigastric pain. Pt describes the epigastric pain as sharp, grabbing. Intermittent. Pt reports she feels like she has to have a BM, but unable. Abdomen is soft at rest, but taut with guarding with light palpation. Pt was sitting on the toilet attempting to have a BM. She reports her "BMs" as clear, like water. Observed by nursing. Pt is unable to clearly identify a specific area of pain. Pt denies diarrhea, fever, chills, chest pain, shortness of breath, headache, dizziness. Appetite poor. Generalized malaise. KUB obtained- negative. Abdominal U/S- negative for acute findings. No n/v today. No acute findings. VSS.    Past Medical History:  Diagnosis Date  . Accident due to mechanical fall without injury   . Acute combined systolic and diastolic CHF, NYHA class 2 ([place])    a. echo 05/05/16: EF 35-40%, RWMA cannot be excluded, GR1DD, mild MR, LA mildly dilated, RV sys fxan nl, dilated IVC, PASP 74 mmHg  . CKD (chronic kidney disease), stage III   . Depression   . Hypertensive  heart disease   . Iron deficiency anemia   . Malnutrition ([place])   . Morbid obesity ([place])   . Schizophrenia [facility])    Past Surgical History:  Procedure Laterality Date  . ANKLE SURGERY Left   . History of Cataract surgery  2008  . History of cervical discectomy  01/10/2008   Dr. Arnoldo Morale, [facility]; Fusion and Plating at C4-5 and 5-6    Allergies  Allergen Reactions  . Anaprox  [Naproxen]   . Fenoprofen Calcium     Allergies as of 06/03/2016      Reactions   Anaprox  [naproxen]    Fenoprofen Calcium       Medication List       Accurate as of 06/03/16  5:11 PM. Always use your most recent med list.          acetaminophen 500 MG tablet Commonly known as:  TYLENOL Take 500 mg by mouth every 6 (six) hours as needed for mild pain or moderate pain.   carvedilol 12.5 MG tablet Commonly known as:  COREG Take 1 tablet (12.5 mg total) by mouth 2 (two) times daily with a meal.   citalopram 20 MG tablet Commonly known as:  CELEXA TAKE ONE (1) TABLET BY MOUTH EVERY DAY   ferrous sulfate 325 (65 FE) MG tablet Take 1 tablet (325 mg total) by mouth 2 (two) times daily with a meal.   furosemide 40 MG tablet Commonly known as:  LASIX Take 1 tablet (  40 mg total) by mouth daily.   lisinopril 20 MG tablet Commonly known as:  PRINIVIL,ZESTRIL Take 1 tablet (20 mg total) by mouth daily.   magnesium oxide 400 (241.3 Mg) MG tablet Commonly known as:  MAG-OX Take 1 tablet (400 mg total) by mouth daily.   QUEtiapine 25 MG tablet Commonly known as:  SEROQUEL Take 0.5 tablets (12.5 mg total) by mouth at bedtime as needed (for insomnia).   senna-docusate 8.6-50 MG tablet Commonly known as:  Senokot-S Take 1 tablet by mouth at bedtime as needed for mild constipation.   traMADol 50 MG tablet Commonly known as:  ULTRAM Take 1 tablet (50 mg total) by mouth every 8 (eight) hours as needed.       Review of Systems  Constitutional: Negative for activity change, appetite change,  chills, diaphoresis and fever.  HENT: Negative for congestion, sneezing, sore throat, trouble swallowing and voice change.   Respiratory: Negative for apnea, cough, choking, chest tightness, shortness of breath and wheezing.   Cardiovascular: Negative for chest pain, palpitations and leg swelling.  Gastrointestinal: Positive for abdominal pain and diarrhea. Negative for abdominal distention, constipation, nausea and vomiting.  Genitourinary: Negative for difficulty urinating, dysuria, frequency and urgency.  Musculoskeletal: Negative for back pain, gait problem and myalgias. Arthralgias: typical arthritis.  Skin: Negative for color change, pallor, rash and wound.  Neurological: Negative for dizziness, tremors, syncope, speech difficulty, weakness, numbness and headaches.  Psychiatric/Behavioral: Negative for agitation and behavioral problems.  All other systems reviewed and are negative.   Immunization History  Administered Date(s) Administered  . Influenza,inj,Quad PF,36+ Mos 05/10/2016  . Pneumococcal Conjugate-13 12/03/2013  . Pneumococcal Polysaccharide-23 02/19/1994  . Td 05/12/2006   Pertinent  Health Maintenance Due  Topic Date Due  . INFLUENZA VACCINE  Completed  . DEXA SCAN  Completed  . PNA vac Low Risk Adult  Completed   No flowsheet data found. Functional Status Survey:    Vitals:   06/03/16 0600  BP: (!) 113/58  Pulse: 62  Resp: 17  Temp: 98.1 F (36.7 C)  SpO2: 96%   There is no height or weight on file to calculate BMI. Physical Exam  Constitutional: She is oriented to person, place, and time. Vital signs are normal. She appears well-developed and well-nourished. She is active and cooperative. She does not appear ill. No distress.  HENT:  Head: Normocephalic and atraumatic.  Mouth/Throat: Uvula is midline, oropharynx is clear and moist and mucous membranes are normal. Mucous membranes are not pale, not dry and not cyanotic.  Eyes: Conjunctivae, EOM and lids  are normal. Pupils are equal, round, and reactive to light.  Neck: Trachea normal, normal range of motion and full passive range of motion without pain. Neck supple. No JVD present. No tracheal deviation, no edema and no erythema present. No thyromegaly present.  Cardiovascular: Normal rate, regular rhythm, normal heart sounds, intact distal pulses and normal pulses.  Exam reveals no gallop, no distant heart sounds and no friction rub.   No murmur heard. Pulmonary/Chest: Effort normal and breath sounds normal. No accessory muscle usage. No respiratory distress. She has no decreased breath sounds. She has no wheezes. She has no rhonchi. She has no rales. She exhibits no tenderness.  Abdominal: Soft. Normal appearance and bowel sounds are normal. She exhibits no distension and no ascites. There is tenderness in the epigastric area. There is guarding. There is no CVA tenderness.  Musculoskeletal: Normal range of motion. She exhibits no edema or tenderness.  Expected  osteoarthritis, stiffness  Neurological: She is alert and oriented to person, place, and time. She has normal strength.  Skin: Skin is warm, dry and intact. No rash noted. She is not diaphoretic. No cyanosis or erythema. No pallor. Nails show no clubbing.  Psychiatric: She has a normal mood and affect. Her speech is normal and behavior is normal. Judgment and thought content normal. Cognition and memory are normal.  Nursing note and vitals reviewed.   Labs reviewed:  Recent Labs  05/06/16 0834  05/07/16 0448  05/17/16 0455 05/31/16 1444 06/02/16 0600  NA 128*  < > 131*  < > 134* 136 138  K  --   --  4.1  < > 3.6 4.2 4.0  CL  --   --  93*  < > 100* 102 110  CO2  --   --  30  < > 28 25 22   GLUCOSE  --   --  83  < > 90 133* 72  BUN  --   --  18  < > 31* 56* 46*  CREATININE  --   --  1.12*  < > 1.26* 2.07* 1.74*  CALCIUM  --   --  8.5*  < > 8.5* 9.3 8.5*  MG 1.2*  --  2.2  --   --  2.7*  --   < > = values in this interval not  displayed.  Recent Labs  05/17/16 0455 05/31/16 1444 06/02/16 0600  AST 23 29 21   ALT 11* 14 10*  ALKPHOS 43 58 45  BILITOT 0.5 0.4 0.4  PROT 6.2* 8.1 5.8*  ALBUMIN 2.9* 3.9 2.7*    Recent Labs  05/17/16 0455 05/31/16 1444 06/02/16 0600  WBC 5.6 5.0 3.8  NEUTROABS 3.2 3.0 1.5  HGB 8.9* 10.6* 9.0*  HCT 26.8* 33.0* 28.5*  MCV 78.5* 80.2 81.7  PLT 243 239 191   Lab Results  Component Value Date   TSH 0.701 05/04/2016   No results found for: HGBA1C Lab Results  Component Value Date   CHOL 120 05/08/2016   HDL 48 05/08/2016   LDLCALC 60 05/08/2016   TRIG 62 05/08/2016   CHOLHDL 2.5 05/08/2016    Significant Diagnostic Results in last 30 days:  US Venous Img Lower Unilateral Right  Result Date: 05/05/2016 CLINICAL DATA:  Right lower extremity edema. EXAM: RIGHT LOWER EXTREMITY VENOUS DOPPLER ULTRASOUND TECHNIQUE: Gray-scale sonography with graded compression, as well as color Doppler and duplex ultrasound were performed to evaluate the lower extremity deep venous systems from the level of the common femoral vein and including the common femoral, femoral, profunda femoral, popliteal and calf veins including the posterior tibial, peroneal and gastrocnemius veins when visible. The superficial great saphenous vein was also interrogated. Spectral Doppler was utilized to evaluate flow at rest and with distal augmentation maneuvers in the common femoral, femoral and popliteal veins. COMPARISON:  None. FINDINGS: Contralateral Common Femoral Vein: Left common femoral vein is patent without thrombus. Common Femoral Vein: No evidence of thrombus. Normal compressibility, respiratory phasicity and response to augmentation. Saphenofemoral Junction: No evidence of thrombus. Normal compressibility and flow on color Doppler imaging. Profunda Femoral Vein: No evidence of thrombus. Normal compressibility and flow on color Doppler imaging. Femoral Vein: No evidence of thrombus. Normal  compressibility, respiratory phasicity and response to augmentation. Popliteal Vein: No evidence of thrombus. Normal compressibility, respiratory phasicity and response to augmentation. Calf Veins: No evidence of thrombus. Normal compressibility and flow on color Doppler imaging. IMPRESSION: Negative for  deep venous thrombosis in right lower extremity. Electronically Signed   By: Markus Daft M.D.   On: 05/05/2016 11:32   Dg Chest Port 1 View  Result Date: 05/05/2016 CLINICAL DATA:  Shortness of breath. EXAM: PORTABLE CHEST 1 VIEW COMPARISON:  01/15/2008 FINDINGS: The heart is enlarged. There is atherosclerosis of the thoracic aorta. Diffuse bilateral reticular opacities with Kerley B-lines consistent with pulmonary edema. Probable left pleural effusion. Left lung base suboptimally assessed due to soft tissue attenuation and portable technique. No confluent airspace disease elsewhere. No pneumothorax. Degenerative change in the spine. IMPRESSION: 1. Cardiomegaly with diffuse reticular opacities most consistent with pulmonary edema. Probable left pleural effusion. Findings suspicious for CHF. 2. Thoracic aortic atherosclerosis. Electronically Signed   By: Jeb Levering M.D.   On: 05/05/2016 05:16       Assessment/Plan 1. Epigastric pain  Bentyl 20 mg po QID  GI Cocktail 30 mL po QID continues  DC Scopolamine patch- 1 patch Q 3 days  Zofran 4 mg po Q 6 hours prn  Phenergan 12.5 mg IM Q 4 hrs prn  Abdominal U/S negative  2. CKD Stage IV  Fluids completed  re-check labs in am  Family/ staff Communication:   Total Time:  Documentation:  Face to Face:  Family/Phone:   Labs/tests ordered:  Cbc, met c,   Medication list reviewed and assessed for continued appropriateness.  Vikki Ports, NP-C Geriatrics [facility] [phone], [place] [address] Cell Phone (Mon-Fri 8am-5pm):  [phone] On Call:  [phone] & follow prompts  after 5pm & weekends Office Phone:  [phone] Office Fax:  [phone]

## 2016-06-08 ENCOUNTER — Other Ambulatory Visit: Payer: Self-pay | Admitting: Internal Medicine

## 2016-06-08 ENCOUNTER — Other Ambulatory Visit (HOSPITAL_COMMUNITY): Payer: Self-pay | Admitting: Internal Medicine

## 2016-06-08 ENCOUNTER — Ambulatory Visit
Admission: RE | Admit: 2016-06-08 | Discharge: 2016-06-08 | Disposition: A | Discharge status: Home / Self Care | Payer: Medicare Other | Source: Ambulatory Visit | Attending: Internal Medicine | Admitting: Internal Medicine

## 2016-06-08 DIAGNOSIS — R935 Abnormal findings on diagnostic imaging of other abdominal regions, including retroperitoneum: Secondary | ICD-10-CM

## 2016-06-08 DIAGNOSIS — R112 Nausea with vomiting, unspecified: Secondary | ICD-10-CM | POA: Insufficient documentation

## 2016-06-08 DIAGNOSIS — R109 Unspecified abdominal pain: Secondary | ICD-10-CM

## 2016-06-08 DIAGNOSIS — R590 Localized enlarged lymph nodes: Secondary | ICD-10-CM | POA: Insufficient documentation

## 2016-06-08 DIAGNOSIS — I7090 Unspecified atherosclerosis: Secondary | ICD-10-CM | POA: Insufficient documentation

## 2016-06-08 DIAGNOSIS — K449 Diaphragmatic hernia without obstruction or gangrene: Secondary | ICD-10-CM | POA: Diagnosis not present

## 2016-06-08 HISTORY — DX: Heart failure, unspecified: I50.9

## 2016-06-08 HISTORY — DX: Disorder of kidney and ureter, unspecified: N28.9

## 2016-06-08 LAB — POCT I-STAT CREATININE: CREATININE: 1.6 mg/dL — AB (ref 0.44–1.00)

## 2016-06-09 ENCOUNTER — Non-Acute Institutional Stay (SKILLED_NURSING_FACILITY): Payer: Medicare Other | Admitting: Gerontology

## 2016-06-09 DIAGNOSIS — K529 Noninfective gastroenteritis and colitis, unspecified: Secondary | ICD-10-CM

## 2016-06-09 DIAGNOSIS — A09 Infectious gastroenteritis and colitis, unspecified: Secondary | ICD-10-CM | POA: Diagnosis not present

## 2016-06-09 NOTE — Progress Notes (Signed)
Location:      Place of Service:  SNF (31) Provider:  Toni Arthurs, NP-C  Margarita Rana, MD  Patient Care Team: Margarita Rana, MD as PCP - General [facility])  Extended Emergency Contact Information Primary Emergency Contact: [facility] Address: [address]           [place], [place] [address] Johnnette Litter of [place] Phone: [phone] Mobile Phone: [phone] Relation: Daughter Secondary Emergency Contact: Eula Fried [place] of [place] Phone: [phone] Mobile Phone: [phone] Relation: Other  Code Status:  dnr Goals of care: Advanced Directive information Advanced Directives 05/04/2016  Does Patient Have a Medical Advance Directive? Yes  Type of Paramedic of [place];Living will  Does patient want to make changes to medical advance directive? No - Patient declined  Copy of [place] in Chart? No - copy requested     Chief Complaint  Patient presents with  . Follow-up    HPI:  Pt is a 83 y.o. female seen today for a follow up visit for ongoing intractable nausea and vomiting. Pt underwent a CT scan of the abdomen and pelvis yesterday. Results showing possible Enteritis vs carcinoma. Pt was started on IV antibiotics, IV fluids and IV antiemetics last evening. Plan is to see how she responds to the IV antibiotics and re-scan if necessary soon, using po contrast. Today, pt reports she is feeling well. She has not had any n/v/d today. She did eat some of her breakfast, though, she reports she usually does not eat breakfast anyways. Pt is tolerating IV fluids and antibiotics well. Continues to c/o suprapubic pain, but only during palpation. Otherwise, she is feeling better. VSS. No other complaints.    Past Medical History:  Diagnosis Date  . Accident due to mechanical fall without injury   . Acute combined systolic and diastolic CHF, NYHA class 2 ([place])    a. echo 05/05/16: EF 35-40%, RWMA cannot  be excluded, GR1DD, mild MR, LA mildly dilated, RV sys fxan nl, dilated IVC, PASP 74 mmHg  . CHF (congestive heart failure) ([place])   . CKD (chronic kidney disease), stage III   . Depression   . Hypertensive heart disease   . Iron deficiency anemia   . Malnutrition ([place])   . Morbid obesity ([place])   . Renal insufficiency   . Schizophrenia [facility])    Past Surgical History:  Procedure Laterality Date  . ANKLE SURGERY Left   . History of Cataract surgery  2008  . History of cervical discectomy  01/10/2008   Dr. Arnoldo Morale, [facility]; Fusion and Plating at C4-5 and 5-6    Allergies  Allergen Reactions  . Anaprox  [Naproxen]   . Fenoprofen Calcium     Allergies as of 06/09/2016      Reactions   Anaprox  [naproxen]    Fenoprofen Calcium       Medication List       Accurate as of 06/09/16 12:19 PM. Always use your most recent med list.          acetaminophen 500 MG tablet Commonly known as:  TYLENOL Take 500 mg by mouth every 6 (six) hours as needed for mild pain or moderate pain.   carvedilol 12.5 MG tablet Commonly known as:  COREG Take 1 tablet (12.5 mg total) by mouth 2 (two) times daily with a meal.   citalopram 20 MG tablet Commonly known as:  CELEXA TAKE ONE (1) TABLET BY MOUTH EVERY DAY  ferrous sulfate 325 (65 FE) MG tablet Take 1 tablet (325 mg total) by mouth 2 (two) times daily with a meal.   furosemide 40 MG tablet Commonly known as:  LASIX Take 1 tablet (40 mg total) by mouth daily.   lisinopril 20 MG tablet Commonly known as:  PRINIVIL,ZESTRIL Take 1 tablet (20 mg total) by mouth daily.   magnesium oxide 400 (241.3 Mg) MG tablet Commonly known as:  MAG-OX Take 1 tablet (400 mg total) by mouth daily.   QUEtiapine 25 MG tablet Commonly known as:  SEROQUEL Take 0.5 tablets (12.5 mg total) by mouth at bedtime as needed (for insomnia).   senna-docusate 8.6-50 MG tablet Commonly known as:  Senokot-S Take 1 tablet by mouth at bedtime as needed for mild  constipation.   traMADol 50 MG tablet Commonly known as:  ULTRAM Take 1 tablet (50 mg total) by mouth every 8 (eight) hours as needed.       Review of Systems  Constitutional: Negative for activity change, appetite change, chills, diaphoresis and fever.  HENT: Negative for congestion, sneezing, sore throat, trouble swallowing and voice change.   Respiratory: Negative for apnea, cough, choking, chest tightness, shortness of breath and wheezing.   Cardiovascular: Negative for chest pain, palpitations and leg swelling.  Gastrointestinal: Positive for abdominal pain. Negative for abdominal distention, constipation, diarrhea, nausea and vomiting.  Genitourinary: Negative for difficulty urinating, dysuria, frequency and urgency.  Musculoskeletal: Negative for back pain, gait problem and myalgias. Arthralgias: typical arthritis.  Skin: Negative for color change, pallor, rash and wound.  Neurological: Negative for dizziness, tremors, syncope, speech difficulty, weakness, numbness and headaches.  Psychiatric/Behavioral: Negative for agitation and behavioral problems.  All other systems reviewed and are negative.   Immunization History  Administered Date(s) Administered  . Influenza,inj,Quad PF,36+ Mos 05/10/2016  . Pneumococcal Conjugate-13 12/03/2013  . Pneumococcal Polysaccharide-23 02/19/1994  . Td 05/12/2006   Pertinent  Health Maintenance Due  Topic Date Due  . INFLUENZA VACCINE  Completed  . DEXA SCAN  Completed  . PNA vac Low Risk Adult  Completed   No flowsheet data found. Functional Status Survey:    Vitals:   06/09/16 0700  BP: (!) 146/78  Pulse: 79  Resp: 18  Temp: 97.8 F (36.6 C)  SpO2: 95%   There is no height or weight on file to calculate BMI. Physical Exam  Constitutional: She is oriented to person, place, and time. Vital signs are normal. She appears well-developed and well-nourished. She is active and cooperative. She does not appear ill. No distress.    HENT:  Head: Normocephalic and atraumatic.  Mouth/Throat: Uvula is midline, oropharynx is clear and moist and mucous membranes are normal. Mucous membranes are not pale, not dry and not cyanotic.  Eyes: Conjunctivae, EOM and lids are normal. Pupils are equal, round, and reactive to light.  Neck: Trachea normal, normal range of motion and full passive range of motion without pain. Neck supple. No JVD present. No tracheal deviation, no edema and no erythema present. No thyromegaly present.  Cardiovascular: Normal rate, regular rhythm, normal heart sounds, intact distal pulses and normal pulses.  Exam reveals no gallop, no distant heart sounds and no friction rub.   No murmur heard. Pulmonary/Chest: Effort normal and breath sounds normal. No accessory muscle usage. No respiratory distress. She has no decreased breath sounds. She has no wheezes. She has no rhonchi. She has no rales. She exhibits no tenderness.  Abdominal: Soft. Normal appearance. She exhibits no distension and  no ascites. Bowel sounds are decreased. There is tenderness in the suprapubic area. There is no guarding and no CVA tenderness.  Musculoskeletal: Normal range of motion. She exhibits no edema or tenderness.  Expected osteoarthritis, stiffness  Neurological: She is alert and oriented to person, place, and time. She has normal strength.  Skin: Skin is warm, dry and intact. No rash noted. She is not diaphoretic. No cyanosis or erythema. No pallor. Nails show no clubbing.  Psychiatric: She has a normal mood and affect. Her speech is normal and behavior is normal. Judgment and thought content normal. Cognition and memory are normal.  Nursing note and vitals reviewed.   Labs reviewed:  Recent Labs  05/06/16 0834  05/07/16 0448  05/17/16 0455 05/31/16 1444 06/02/16 0600 06/08/16 1442  NA 128*  < > 131*  < > 134* 136 138  --   K  --   --  4.1  < > 3.6 4.2 4.0  --   CL  --   --  93*  < > 100* 102 110  --   CO2  --   --  30   < > 28 25 22   --   GLUCOSE  --   --  83  < > 90 133* 72  --   BUN  --   --  18  < > 31* 56* 46*  --   CREATININE  --   --  1.12*  < > 1.26* 2.07* 1.74* 1.60*  CALCIUM  --   --  8.5*  < > 8.5* 9.3 8.5*  --   MG 1.2*  --  2.2  --   --  2.7*  --   --   < > = values in this interval not displayed.  Recent Labs  05/17/16 0455 05/31/16 1444 06/02/16 0600  AST 23 29 21   ALT 11* 14 10*  ALKPHOS 43 58 45  BILITOT 0.5 0.4 0.4  PROT 6.2* 8.1 5.8*  ALBUMIN 2.9* 3.9 2.7*    Recent Labs  05/17/16 0455 05/31/16 1444 06/02/16 0600  WBC 5.6 5.0 3.8  NEUTROABS 3.2 3.0 1.5  HGB 8.9* 10.6* 9.0*  HCT 26.8* 33.0* 28.5*  MCV 78.5* 80.2 81.7  PLT 243 239 191   Lab Results  Component Value Date   TSH 0.701 05/04/2016   No results found for: HGBA1C Lab Results  Component Value Date   CHOL 120 05/08/2016   HDL 48 05/08/2016   LDLCALC 60 05/08/2016   TRIG 62 05/08/2016   CHOLHDL 2.5 05/08/2016    Significant Diagnostic Results in last 30 days:  Ct Abdomen Pelvis Wo Contrast  Result Date: 06/08/2016 CLINICAL DATA:  Abdominal pain with nausea and vomiting. Chronic kidney disease. EXAM: CT ABDOMEN AND PELVIS WITHOUT CONTRAST TECHNIQUE: Multidetector CT imaging of the abdomen and pelvis was performed following the standard protocol without IV contrast. COMPARISON:  Limited correlation made with AP pelvic radiograph 01/07/2008 and lumbar spine MRI 03/05/2006. FINDINGS: Lower chest: Coronary artery atherosclerosis and mitral annular calcifications are present. There are possible calcifications of the aortic valve. The heart size is normal. There is no pleural or pericardial effusion. Mild atelectasis or scarring is present at both lung bases. There is a large hiatal hernia with most of the stomach herniated into the posterior mediastinum. Some bowel is also herniated into the lower chest (probably the transverse colon). Hepatobiliary: The liver appears unremarkable as imaged in the noncontrast state.  No evidence of gallstones, gallbladder wall thickening or  biliary dilatation. Pancreas: Unremarkable. No pancreatic ductal dilatation or surrounding inflammatory changes. Spleen: Normal in size without focal abnormality. Adrenals/Urinary Tract: Both adrenal glands appear normal. Both kidneys demonstrate mild cortical thinning. There is no evidence of renal mass, urinary tract calculus, hydronephrosis or perinephric soft tissue stranding. The bladder is moderately distended without wall thickening. Stomach/Bowel: No enteric contrast was administered. As above, there is a large hiatal hernia which contains most of the stomach and probably a portion of the transverse colon. No evidence of bowel obstruction or incarceration. There is suggested wall thickening of the small bowel in the right lower quadrant (images 18-25 of series 5. There are inflammatory changes in the surrounding mesentery. The appendix appears normal. The colon is decompressed without apparent wall thickening. Vascular/Lymphatic: Moderate aortic and branch vessel atherosclerosis. As above, there are inflammatory changes in the right lower quadrant mesentery with enlarged mesenteric nodes or soft tissue masses which are not retractile. One measuring 1.6 cm on image 45 demonstrates central calcification. There is an elongated soft tissue nodule measuring up to 4.2 cm on image 42. No retroperitoneal lymphadenopathy. Reproductive: The uterus and adnexa appear unremarkable. No evidence of adnexal mass. Other: No ascites, peritoneal nodularity or focal extraluminal fluid collections are identified. There is no free air. Musculoskeletal: No acute osseous findings. There are extensive degenerative changes throughout the lumbar spine associated with a convex left scoliosis. IMPRESSION: 1. Apparent inflammatory process in the false pelvis with possible distal small bowel wall thickening, soft tissue stranding in the mesenteric fat and partially calcified  lymphadenopathy. Differential considerations include enteritis with reactive adenopathy and neoplasm (including carcinoid tumor and lymphoma). Follow up CT with enteric contrast may be helpful. 2. Large hiatal hernia containing most of the stomach and a portion of the transverse colon. No evidence of bowel obstruction. 3. The appendix appears normal. 4. Moderate atherosclerosis. Electronically Signed   By: Richardean Sale M.D.   On: 06/08/2016 15:37    Assessment/Plan 1. Enteritis, infectious, presumed  Cipro 400 mg IV Q 12 hours x 7 days  Metronidazole 500 mg IV Q 12 hours x 7 days  Zofran 4 mg IV Q 6 hours x 3 days  Scopolamine patch 1 patch TD Q 3 days  0.9% NS @75  ml/ hr x 72 hours  Lorazepam 0.5 mg 1 tablet po Q 4 hours prn- n/v/anxiety  Family/ staff Communication:   Total Time:  Documentation:  Face to Face:  Family/Phone:   Labs/tests ordered:  CT abdomen obtained yesterday- reviewed; CBC, MEt C, CRP, ESR  Medication list reviewed and assessed for continued appropriateness.  Vikki Ports, NP-C Geriatrics [facility] [phone], [place] [address] Cell Phone (Mon-Fri 8am-5pm):  [phone] On Call:  [phone] & follow prompts after 5pm & weekends Office Phone:  [phone] Office Fax:  [phone]

## 2016-06-10 LAB — CBC WITH DIFFERENTIAL/PLATELET
Basophils Absolute: 0 10*3/uL (ref 0–0.1)
Basophils Relative: 1 %
EOS ABS: 0.1 10*3/uL (ref 0–0.7)
Eosinophils Relative: 1 %
HCT: 27 % — ABNORMAL LOW (ref 35.0–47.0)
Hemoglobin: 8.7 g/dL — ABNORMAL LOW (ref 12.0–16.0)
LYMPHS ABS: 1.5 10*3/uL (ref 1.0–3.6)
Lymphocytes Relative: 30 %
MCH: 26 pg (ref 26.0–34.0)
MCHC: 32.4 g/dL (ref 32.0–36.0)
MCV: 80.3 fL (ref 80.0–100.0)
MONOS PCT: 17 %
Monocytes Absolute: 0.8 10*3/uL (ref 0.2–0.9)
NEUTROS ABS: 2.5 10*3/uL (ref 1.4–6.5)
Neutrophils Relative %: 51 %
Platelets: 145 10*3/uL — ABNORMAL LOW (ref 150–440)
RBC: 3.36 MIL/uL — AB (ref 3.80–5.20)
RDW: 24.3 % — ABNORMAL HIGH (ref 11.5–14.5)
WBC: 4.9 10*3/uL (ref 3.6–11.0)

## 2016-06-10 LAB — SEDIMENTATION RATE: Sed Rate: 24 mm/hr (ref 0–30)

## 2016-06-10 LAB — COMPREHENSIVE METABOLIC PANEL
ALK PHOS: 44 U/L (ref 38–126)
ALT: 10 U/L — AB (ref 14–54)
AST: 25 U/L (ref 15–41)
Albumin: 2.5 g/dL — ABNORMAL LOW (ref 3.5–5.0)
Anion gap: 5 (ref 5–15)
BILIRUBIN TOTAL: 0.3 mg/dL (ref 0.3–1.2)
BUN: 27 mg/dL — ABNORMAL HIGH (ref 6–20)
CALCIUM: 8.3 mg/dL — AB (ref 8.9–10.3)
CO2: 24 mmol/L (ref 22–32)
Chloride: 113 mmol/L — ABNORMAL HIGH (ref 101–111)
Creatinine, Ser: 1.67 mg/dL — ABNORMAL HIGH (ref 0.44–1.00)
GFR calc non Af Amer: 26 mL/min — ABNORMAL LOW (ref >60)
GFR, EST AFRICAN AMERICAN: 30 mL/min — AB (ref >60)
GLUCOSE: 82 mg/dL (ref 65–99)
Potassium: 3 mmol/L — ABNORMAL LOW (ref 3.5–5.1)
SODIUM: 142 mmol/L (ref 135–145)
TOTAL PROTEIN: 5.4 g/dL — AB (ref 6.5–8.1)

## 2016-06-10 LAB — C-REACTIVE PROTEIN: CRP: 1.3 mg/dL — AB (ref <1.0)

## 2016-06-11 LAB — BASIC METABOLIC PANEL
ANION GAP: 8 (ref 5–15)
BUN: 24 mg/dL — AB (ref 6–20)
CALCIUM: 8.1 mg/dL — AB (ref 8.9–10.3)
CO2: 19 mmol/L — ABNORMAL LOW (ref 22–32)
Chloride: 115 mmol/L — ABNORMAL HIGH (ref 101–111)
Creatinine, Ser: 1.75 mg/dL — ABNORMAL HIGH (ref 0.44–1.00)
GFR calc Af Amer: 29 mL/min — ABNORMAL LOW (ref >60)
GFR, EST NON AFRICAN AMERICAN: 25 mL/min — AB (ref >60)
GLUCOSE: 64 mg/dL — AB (ref 65–99)
Potassium: 3.6 mmol/L (ref 3.5–5.1)
Sodium: 142 mmol/L (ref 135–145)

## 2016-06-14 LAB — COMPREHENSIVE METABOLIC PANEL
ALT: 11 U/L — AB (ref 14–54)
AST: 26 U/L (ref 15–41)
Albumin: 2.9 g/dL — ABNORMAL LOW (ref 3.5–5.0)
Alkaline Phosphatase: 52 U/L (ref 38–126)
Anion gap: 10 (ref 5–15)
BILIRUBIN TOTAL: 0.6 mg/dL (ref 0.3–1.2)
BUN: 20 mg/dL (ref 6–20)
CO2: 24 mmol/L (ref 22–32)
CREATININE: 1.41 mg/dL — AB (ref 0.44–1.00)
Calcium: 8.7 mg/dL — ABNORMAL LOW (ref 8.9–10.3)
Chloride: 108 mmol/L (ref 101–111)
GFR, EST AFRICAN AMERICAN: 37 mL/min — AB (ref >60)
GFR, EST NON AFRICAN AMERICAN: 32 mL/min — AB (ref >60)
Glucose, Bld: 105 mg/dL — ABNORMAL HIGH (ref 65–99)
POTASSIUM: 3.2 mmol/L — AB (ref 3.5–5.1)
Sodium: 142 mmol/L (ref 135–145)
TOTAL PROTEIN: 6.6 g/dL (ref 6.5–8.1)

## 2016-06-14 LAB — CBC WITH DIFFERENTIAL/PLATELET
Basophils Absolute: 0 10*3/uL (ref 0–0.1)
Basophils Relative: 1 %
EOS ABS: 0.1 10*3/uL (ref 0–0.7)
EOS PCT: 2 %
HCT: 32.6 % — ABNORMAL LOW (ref 35.0–47.0)
Hemoglobin: 11 g/dL — ABNORMAL LOW (ref 12.0–16.0)
LYMPHS ABS: 1 10*3/uL (ref 1.0–3.6)
LYMPHS PCT: 14 %
MCH: 26.8 pg (ref 26.0–34.0)
MCHC: 33.6 g/dL (ref 32.0–36.0)
MCV: 79.8 fL — AB (ref 80.0–100.0)
MONOS PCT: 13 %
Monocytes Absolute: 0.9 10*3/uL (ref 0.2–0.9)
Neutro Abs: 5.4 10*3/uL (ref 1.4–6.5)
Neutrophils Relative %: 72 %
PLATELETS: 156 10*3/uL (ref 150–440)
RBC: 4.09 MIL/uL (ref 3.80–5.20)
RDW: 24.4 % — ABNORMAL HIGH (ref 11.5–14.5)
WBC: 7.5 10*3/uL (ref 3.6–11.0)

## 2016-06-14 LAB — SEDIMENTATION RATE: Sed Rate: 31 mm/hr — ABNORMAL HIGH (ref 0–30)

## 2016-06-14 LAB — C-REACTIVE PROTEIN: CRP: 9 mg/dL — AB (ref <1.0)

## 2016-06-15 ENCOUNTER — Ambulatory Visit: Payer: Medicare Other

## 2016-06-15 ENCOUNTER — Other Ambulatory Visit: Payer: Self-pay | Admitting: Gerontology

## 2016-06-15 ENCOUNTER — Ambulatory Visit
Admission: RE | Admit: 2016-06-15 | Discharge: 2016-06-15 | Disposition: A | Discharge status: Home / Self Care | Payer: Medicare Other | Source: Ambulatory Visit | Attending: Gerontology | Admitting: Gerontology

## 2016-06-15 ENCOUNTER — Non-Acute Institutional Stay (SKILLED_NURSING_FACILITY): Payer: Medicare Other | Admitting: Gerontology

## 2016-06-15 DIAGNOSIS — R112 Nausea with vomiting, unspecified: Secondary | ICD-10-CM

## 2016-06-15 DIAGNOSIS — R590 Localized enlarged lymph nodes: Secondary | ICD-10-CM | POA: Diagnosis not present

## 2016-06-15 DIAGNOSIS — R1013 Epigastric pain: Secondary | ICD-10-CM | POA: Diagnosis not present

## 2016-06-15 DIAGNOSIS — R109 Unspecified abdominal pain: Secondary | ICD-10-CM

## 2016-06-15 DIAGNOSIS — K449 Diaphragmatic hernia without obstruction or gangrene: Secondary | ICD-10-CM | POA: Diagnosis not present

## 2016-06-15 DIAGNOSIS — I2510 Atherosclerotic heart disease of native coronary artery without angina pectoris: Secondary | ICD-10-CM | POA: Insufficient documentation

## 2016-06-17 ENCOUNTER — Encounter
Admission: RE | Admit: 2016-06-17 | Discharge: 2016-06-17 | Disposition: A | Discharge status: Home / Self Care | Payer: Medicare Other | Source: Ambulatory Visit | Attending: Internal Medicine | Admitting: Internal Medicine

## 2016-06-24 ENCOUNTER — Non-Acute Institutional Stay (SKILLED_NURSING_FACILITY): Payer: Medicare Other | Admitting: Gerontology

## 2016-06-24 DIAGNOSIS — R109 Unspecified abdominal pain: Secondary | ICD-10-CM | POA: Diagnosis not present

## 2016-06-24 DIAGNOSIS — Z515 Encounter for palliative care: Secondary | ICD-10-CM | POA: Diagnosis not present

## 2016-06-24 DIAGNOSIS — R112 Nausea with vomiting, unspecified: Secondary | ICD-10-CM | POA: Diagnosis not present

## 2016-06-24 NOTE — Progress Notes (Signed)
Location:      Place of Service:  SNF (31) Provider:  Lorenso QuarryShannon Ison Wichmann, NP-C  Lorie PhenixNancy Maloney, MD  Patient Care Team: Lorie PhenixNancy Maloney, MD as PCP - General [facility](Family Medicine)  Extended Emergency Contact Information Primary Emergency Contact: [facility],Lynn Address: [address]           [place], [place] [id number] [place] Home Phone: [phone] Mobile Phone: [phone] Relation: Daughter Secondary Emergency Contact: Karlene LinemanHodge,Bob  [place] Home Phone: [phone] Mobile Phone: [phone] Relation: Other  Code Status:  DNR Goals of care: Advanced Directive information Advanced Directives 05/04/2016  Does Patient Have a Medical Advance Directive? Yes  Type of Estate agentAdvance Directive Healthcare Power of [place];Living will  Does patient want to make changes to medical advance directive? No - Patient declined  Copy of Healthcare Power of Attorney in Chart? No - copy requested     Chief Complaint  Patient presents with  . Follow-up    HPI:  Pt is a 83 y.o. female seen today for follow visit for intractable nausea and vomiting, severe abdominal pain. Pt is a DNR. Family has verbalized to the Hospice RN that they would rather see pt sedated/sleeping all the time, than see her in severe pain. Pt was on scheduled and prn Roxanol 2 days ago. Dose was increased yesterday to 10 mg QID scheduled. Pt continues to complain of severe abdominal pain. Reports the Roxanol has helped but doesn't last too long. Pt is also having increased anxiety. Pt is unable to swallow even 5 mL water without obvious nausea. Extensive discussion had with Hospice RN about plan of care/ plan for pain control. Hospice updated family once plan was established. Family in agreement with plan and thankful for the interventions. Upon assessment, pt was responsive and answered yes/no question, drank 5 mL water, but would not open her eyes or engage in conversation. She is pale and tachypneic at  times. Nursing applied O2 2 L  today d/t sats in the 70s. No family present at bedside at this time. Will monitor closely.     Past Medical History:  Diagnosis Date  . Accident due to mechanical fall without injury   . Acute combined systolic and diastolic CHF, NYHA class 2 (HCC)    a. echo 05/05/16: EF 35-40%, RWMA cannot be excluded, GR1DD, mild MR, LA mildly dilated, RV sys fxan nl, dilated IVC, PASP 74 mmHg  . CHF (congestive heart failure) (HCC)   . CKD (chronic kidney disease), stage III   . Depression   . Hypertensive heart disease   . Iron deficiency anemia   . Malnutrition (HCC)   . Morbid obesity (HCC)   . Renal insufficiency   . Schizophrenia [facility](HCC)    Past Surgical History:  Procedure Laterality Date  . ANKLE SURGERY Left   . History of Cataract surgery  2008  . History of cervical discectomy  01/10/2008   [facility] Fusion and Plating at C4-5 and 5-6    Allergies  Allergen Reactions  . Anaprox  [Naproxen]   . Fenoprofen Calcium     Allergies as of 06/24/2016      Reactions   Anaprox  [naproxen]    Fenoprofen Calcium       Medication List       Accurate as of 06/24/16  9:10 PM. Always use your most recent med list.          acetaminophen 500 MG tablet Commonly known as:  TYLENOL Take 500  mg by mouth every 6 (six) hours as needed for mild pain or moderate pain.   carvedilol 12.5 MG tablet Commonly known as:  COREG Take 1 tablet (12.5 mg total) by mouth 2 (two) times daily with a meal.   citalopram 20 MG tablet Commonly known as:  CELEXA TAKE ONE (1) TABLET BY MOUTH EVERY DAY   ferrous sulfate 325 (65 FE) MG tablet Take 1 tablet (325 mg total) by mouth 2 (two) times daily with a meal.   furosemide 40 MG tablet Commonly known as:  LASIX Take 1 tablet (40 mg total) by mouth daily.   lisinopril 20 MG tablet Commonly known as:  PRINIVIL,ZESTRIL Take 1 tablet (20 mg total) by mouth daily.   magnesium oxide 400 (241.3 Mg) MG  tablet Commonly known as:  MAG-OX Take 1 tablet (400 mg total) by mouth daily.   QUEtiapine 25 MG tablet Commonly known as:  SEROQUEL Take 0.5 tablets (12.5 mg total) by mouth at bedtime as needed (for insomnia).   senna-docusate 8.6-50 MG tablet Commonly known as:  Senokot-S Take 1 tablet by mouth at bedtime as needed for mild constipation.   traMADol 50 MG tablet Commonly known as:  ULTRAM Take 1 tablet (50 mg total) by mouth every 8 (eight) hours as needed.       Review of Systems  Constitutional: Negative for activity change, appetite change, chills, diaphoresis and fever.  HENT: Negative for congestion, sneezing, sore throat, trouble swallowing and voice change.   Respiratory: Negative for apnea, cough, choking, chest tightness, shortness of breath and wheezing.   Cardiovascular: Negative for chest pain, palpitations and leg swelling.  Gastrointestinal: Positive for abdominal pain. Negative for abdominal distention, constipation, diarrhea, nausea and vomiting.  Genitourinary: Negative for difficulty urinating, dysuria, frequency and urgency.  Musculoskeletal: Negative for back pain, gait problem and myalgias. Arthralgias: typical arthritis.  Skin: Negative for color change, pallor, rash and wound.  Neurological: Negative for dizziness, tremors, syncope, speech difficulty, weakness, numbness and headaches.  Psychiatric/Behavioral: Negative for agitation and behavioral problems.  All other systems reviewed and are negative.   Immunization History  Administered Date(s) Administered  . Influenza,inj,Quad PF,36+ Mos 05/10/2016  . Pneumococcal Conjugate-13 12/03/2013  . Pneumococcal Polysaccharide-23 02/19/1994  . Td 05/12/2006   Pertinent  Health Maintenance Due  Topic Date Due  . INFLUENZA VACCINE  Completed  . DEXA SCAN  Completed  . PNA vac Low Risk Adult  Completed   No flowsheet data found. Functional Status Survey:    Vitals:   06/24/16 0400  BP: 109/90   Pulse: 75  Resp: 20  Temp: 97.9 F (36.6 C)  SpO2: 94%   There is no height or weight on file to calculate BMI. Physical Exam  Constitutional: She is oriented to person, place, and time. Vital signs are normal. She appears well-developed and well-nourished. She appears lethargic. She is active and cooperative. She appears ill. No distress. Nasal cannula in place.  HENT:  Head: Normocephalic and atraumatic.  Mouth/Throat: Uvula is midline, oropharynx is clear and moist and mucous membranes are normal. Mucous membranes are not pale, not dry and not cyanotic.  Eyes: Conjunctivae, EOM and lids are normal. Pupils are equal, round, and reactive to light.  Neck: Trachea normal, normal range of motion and full passive range of motion without pain. Neck supple. No JVD present. No tracheal deviation, no edema and no erythema present. No thyromegaly present.  Cardiovascular: Normal rate, regular rhythm, normal heart sounds, intact distal pulses and normal  pulses.  Exam reveals no gallop, no distant heart sounds and no friction rub.   No murmur heard. Pulmonary/Chest: Effort normal and breath sounds normal. No accessory muscle usage. No respiratory distress. She has no decreased breath sounds. She has no wheezes. She has no rhonchi. She has no rales. She exhibits no tenderness.  Abdominal: Soft. Normal appearance. She exhibits no distension and no ascites. Bowel sounds are decreased. There is tenderness in the epigastric area. There is guarding. There is no CVA tenderness.  Musculoskeletal: Normal range of motion. She exhibits no edema or tenderness.  Expected osteoarthritis, stiffness  Neurological: She is oriented to person, place, and time. She has normal strength. She appears lethargic.  Skin: Skin is warm, dry and intact. No rash noted. She is not diaphoretic. No cyanosis or erythema. There is pallor. Nails show no clubbing.  Psychiatric: She has a normal mood and affect. Her speech is normal and  behavior is normal. Judgment and thought content normal. Cognition and memory are normal.  Nursing note and vitals reviewed.   Labs reviewed:  Recent Labs  05/06/16 0834  05/07/16 0448  05/31/16 1444  06/10/16 0730 06/11/16 0800 06/14/16 1639  NA 128*  < > 131*  < > 136  < > 142 142 142  K  --   --  4.1  < > 4.2  < > 3.0* 3.6 3.2*  CL  --   --  93*  < > 102  < > 113* 115* 108  CO2  --   --  30  < > 25  < > 24 19* 24  GLUCOSE  --   --  83  < > 133*  < > 82 64* 105*  BUN  --   --  18  < > 56*  < > 27* 24* 20  CREATININE  --   --  1.12*  < > 2.07*  < > 1.67* 1.75* 1.41*  CALCIUM  --   --  8.5*  < > 9.3  < > 8.3* 8.1* 8.7*  MG 1.2*  --  2.2  --  2.7*  --   --   --   --   < > = values in this interval not displayed.  Recent Labs  06/02/16 0600 06/10/16 0730 06/14/16 1639  AST 21 25 26   ALT 10* 10* 11*  ALKPHOS 45 44 52  BILITOT 0.4 0.3 0.6  PROT 5.8* 5.4* 6.6  ALBUMIN 2.7* 2.5* 2.9*    Recent Labs  06/02/16 0600 06/10/16 0730 06/14/16 1026  WBC 3.8 4.9 7.5  NEUTROABS 1.5 2.5 5.4  HGB 9.0* 8.7* 11.0*  HCT 28.5* 27.0* 32.6*  MCV 81.7 80.3 79.8*  PLT 191 145* 156   Lab Results  Component Value Date   TSH 0.701 05/04/2016   No results found for: HGBA1C Lab Results  Component Value Date   CHOL 120 05/08/2016   HDL 48 05/08/2016   LDLCALC 60 05/08/2016   TRIG 62 05/08/2016   CHOLHDL 2.5 05/08/2016    Significant Diagnostic Results in last 30 days:  Ct Abdomen Pelvis Wo Contrast  Result Date: 06/15/2016 CLINICAL DATA:  Nausea, vomiting and diarrhea. EXAM: CT ABDOMEN AND PELVIS WITHOUT CONTRAST TECHNIQUE: Multidetector CT imaging of the abdomen and pelvis was performed following the standard protocol without IV contrast. COMPARISON:  06/08/2016 FINDINGS: Lower chest: Large hiatal hernia with most of the stomach up in the chest. The gurney also contains part of the colon. No findings for obstruction  or incarceration. The lung bases are grossly clear. There are  scarring changes and atelectasis. Hepatobiliary: No focal hepatic lesions or intrahepatic biliary dilatation. The gallbladder is normal. No common bile duct dilatation. Pancreas: Moderate atrophy of the pancreas but no mass, inflammation or ductal dilatation. Spleen: Normal size.  No focal lesions. Adrenals/Urinary Tract: The adrenal glands and kidneys are stable. Small renal calculi but no obstructing ureteral calculi or bladder calculi. Stomach/Bowel: There is abnormal distal small bowel wall thickening and interstitial changes in the mesenteric fat. No obstruction. The colon is grossly normal. The appendix is normal. The terminal ileum is mildly thickened. Vascular/Lymphatic: Persistent soft tissue masses/ adenopathy in the root of the small bowel mesenteric. Stable calcified soft tissue lesion measuring 16 mm on image number 47. There are also enlarged adjacent lymph nodes measuring up to 22 mm. No other calcifications noted centrally on image number 50. Findings somewhat concerning for carcinoid tumor or lymphoma. Distal ileal enteritis is also possible but less likely with the lymphadenopathy. Reproductive: The uterus and ovaries are unremarkable and stable. Other: No inguinal mass or adenopathy. Small periumbilical abdominal wall hernia containing fat. No subcutaneous lesions. Musculoskeletal: Scoliosis and degenerative lumbar spondylosis with severe multilevel disc disease and facet disease. No acute bony findings. IMPRESSION: 1. Persistent bowel wall thickening involving the distal loops of ileum along with mild mesenteric inflammations/edema and enlarged lymph nodes in the small bowel mesenteric. Persistent calcified soft tissue lesion is also again demonstrated. Could not exclude lymphoma or carcinoid tumor. 2. No small bowel obstruction or free air. 3. Stable large hiatal hernia. 4. Suspect cholelithiasis. 5. Stable atherosclerotic calcifications involving the aorta and branch vessels and three-vessel  coronary artery calcifications. Electronically Signed   By: Rudie Meyer M.D.   On: 06/15/2016 15:30   Ct Abdomen Pelvis Wo Contrast  Result Date: 06/08/2016 CLINICAL DATA:  Abdominal pain with nausea and vomiting. Chronic kidney disease. EXAM: CT ABDOMEN AND PELVIS WITHOUT CONTRAST TECHNIQUE: Multidetector CT imaging of the abdomen and pelvis was performed following the standard protocol without IV contrast. COMPARISON:  Limited correlation made with AP pelvic radiograph 01/07/2008 and lumbar spine MRI 03/05/2006. FINDINGS: Lower chest: Coronary artery atherosclerosis and mitral annular calcifications are present. There are possible calcifications of the aortic valve. The heart size is normal. There is no pleural or pericardial effusion. Mild atelectasis or scarring is present at both lung bases. There is a large hiatal hernia with most of the stomach herniated into the posterior mediastinum. Some bowel is also herniated into the lower chest (probably the transverse colon). Hepatobiliary: The liver appears unremarkable as imaged in the noncontrast state. No evidence of gallstones, gallbladder wall thickening or biliary dilatation. Pancreas: Unremarkable. No pancreatic ductal dilatation or surrounding inflammatory changes. Spleen: Normal in size without focal abnormality. Adrenals/Urinary Tract: Both adrenal glands appear normal. Both kidneys demonstrate mild cortical thinning. There is no evidence of renal mass, urinary tract calculus, hydronephrosis or perinephric soft tissue stranding. The bladder is moderately distended without wall thickening. Stomach/Bowel: No enteric contrast was administered. As above, there is a large hiatal hernia which contains most of the stomach and probably a portion of the transverse colon. No evidence of bowel obstruction or incarceration. There is suggested wall thickening of the small bowel in the right lower quadrant (images 18-25 of series 5. There are inflammatory changes  in the surrounding mesentery. The appendix appears normal. The colon is decompressed without apparent wall thickening. Vascular/Lymphatic: Moderate aortic and branch vessel atherosclerosis. As above, there are inflammatory  changes in the right lower quadrant mesentery with enlarged mesenteric nodes or soft tissue masses which are not retractile. One measuring 1.6 cm on image 45 demonstrates central calcification. There is an elongated soft tissue nodule measuring up to 4.2 cm on image 42. No retroperitoneal lymphadenopathy. Reproductive: The uterus and adnexa appear unremarkable. No evidence of adnexal mass. Other: No ascites, peritoneal nodularity or focal extraluminal fluid collections are identified. There is no free air. Musculoskeletal: No acute osseous findings. There are extensive degenerative changes throughout the lumbar spine associated with a convex left scoliosis. IMPRESSION: 1. Apparent inflammatory process in the false pelvis with possible distal small bowel wall thickening, soft tissue stranding in the mesenteric fat and partially calcified lymphadenopathy. Differential considerations include enteritis with reactive adenopathy and neoplasm (including carcinoid tumor and lymphoma). Follow up CT with enteric contrast may be helpful. 2. Large hiatal hernia containing most of the stomach and a portion of the transverse colon. No evidence of bowel obstruction. 3. The appendix appears normal. 4. Moderate atherosclerosis. Electronically Signed   By: Carey Bullocks M.D.   On: 06/08/2016 15:37    Assessment/Plan 1. Encounter for dying care  Acetaminophen 650 mg PR Q 4 hours prn- fever  Atropine 1% drops- 2 drops under the tongue Q 30 minutes prn for secretions  Lorazepam Intensol 2 mg/ml- 0.25 mL po Q 4 hours ATC, and  Lorazepam Intensol 2 mg/ml- 0.25 mL po Q 4 hours prn/ May give in addition to scheduled doses for severe symptoms  Roxanol (Morphine) as listed below  DC all non-essential/  non-comfort meds  Acetaminophen PO  Bentyl  Cardedilol  Cholecalciferol  Celexa  Cyanocobalamin  Ferrous Sulfate  Furosemide  Haldol- scheduled and prn  Lisinopril  Magnesium Oxide  Mirtazepine  Ranitidine  Tramadol  2. Continuous severe abdominal pain  Fentanyl 12.5 mcg TD patch Q 3 day  Roxanol 0.25 mL PO QID scheduled for 12 more hours  Roxanol 0.25-0.5 mL PO Q 1 hour prn- pain, dyspnea  Continue Prednisone taper as tolerated  3. Intractable vomiting with nausea, unspecified vomiting type  Continue Scopolamine patch x 2- change Q 3 days  Continue Phenergan 12.5 mg IM Q 4 hours prn  Lorazepam 0.5 mg po Q 4 hours scheduled   Family/ staff Communication:   Total Time:  Documentation:  Face to Face:  Family/Phone:   Labs/tests ordered:    Medication list reviewed and assessed for continued appropriateness. Monthly medication orders reviewed and signed.  Brynda Rim, NP-C [facility] [phone] [address], [place] [address] Cell Phone (Mon-Fri 8am-5pm):  [phone] On Call:  [phone] & follow prompts after 5pm & weekends Office Phone:  [phone] Office Fax:  [phone]

## 2016-06-24 NOTE — Progress Notes (Signed)
Location:      Place of Service:  SNF (31) Provider:  Lorenso Quarry, NP-C  Lorie Phenix, MD  Patient Care Team: Lorie Phenix, MD as PCP - General [facility] Medicine)  Extended Emergency Contact Information Primary Emergency Contact: [facility] Address: [address]           [place], [place] [address] [place] of [place] Home Phone: [phone] Mobile Phone: [phone] Relation: Daughter Secondary Emergency Contact: Karlene Lineman [place] of [place] Home Phone: [phone] Mobile Phone: [phone] Relation: Other  Code Status:  dnr Goals of care: Advanced Directive information Advanced Directives 05/04/2016  Does Patient Have a Medical Advance Directive? Yes  Type of Estate agent of [place];Living will  Does patient want to make changes to medical advance directive? No - Patient declined  Copy of Healthcare Power of Attorney in Chart? No - copy requested     Chief Complaint  Patient presents with  . Follow-up    HPI:  Pt is a 83 y.o. female seen today for a follow up visit for intractable nausea and vomiting with diffuse epigastric pain. Pt describes the epigastric pain as sharp, grabbing. Intermittent. Pt reports she feels like she has to have a BM, but unable. Abdomen is soft at rest, but taut with guarding with light palpation.Pt is unable to clearly identify a specific area of pain. Pt denies diarrhea, fever, chills, chest pain, shortness of breath, headache, dizziness. Appetite poor. Generalized malaise. KUB obtained- negative. Abdominal U/S- negative for acute findings. CT abdomen without contrast- Enteritis vs CA. Some n/v today. No acute findings. VSS. Will send for repeat CT abdomen with oral contrast for more detail.    Past Medical History:  Diagnosis Date  . Accident due to mechanical fall without injury   . Acute combined systolic and diastolic CHF, NYHA class 2 (HCC)    a. echo 05/05/16: EF 35-40%, RWMA cannot be  excluded, GR1DD, mild MR, LA mildly dilated, RV sys fxan nl, dilated IVC, PASP 74 mmHg  . CHF (congestive heart failure) (HCC)   . CKD (chronic kidney disease), stage III   . Depression   . Hypertensive heart disease   . Iron deficiency anemia   . Malnutrition (HCC)   . Morbid obesity (HCC)   . Renal insufficiency   . Schizophrenia [facility])    Past Surgical History:  Procedure Laterality Date  . ANKLE SURGERY Left   . History of Cataract surgery  2008  . History of cervical discectomy  01/10/2008   Dr. Lovell Sheehan, [facility]; Fusion and Plating at C4-5 and 5-6    Allergies  Allergen Reactions  . Anaprox  [Naproxen]   . Fenoprofen Calcium     Allergies as of 06/15/2016      Reactions   Anaprox  [naproxen]    Fenoprofen Calcium       Medication List       Accurate as of 06/15/16 11:59 PM. Always use your most recent med list.          acetaminophen 500 MG tablet Commonly known as:  TYLENOL Take 500 mg by mouth every 6 (six) hours as needed for mild pain or moderate pain.   carvedilol 12.5 MG tablet Commonly known as:  COREG Take 1 tablet (12.5 mg total) by mouth 2 (two) times daily with a meal.   citalopram 20 MG tablet Commonly known as:  CELEXA TAKE ONE (1) TABLET BY MOUTH EVERY DAY   ferrous sulfate 325 (65 FE) MG tablet Take  1 tablet (325 mg total) by mouth 2 (two) times daily with a meal.   furosemide 40 MG tablet Commonly known as:  LASIX Take 1 tablet (40 mg total) by mouth daily.   lisinopril 20 MG tablet Commonly known as:  PRINIVIL,ZESTRIL Take 1 tablet (20 mg total) by mouth daily.   magnesium oxide 400 (241.3 Mg) MG tablet Commonly known as:  MAG-OX Take 1 tablet (400 mg total) by mouth daily.   QUEtiapine 25 MG tablet Commonly known as:  SEROQUEL Take 0.5 tablets (12.5 mg total) by mouth at bedtime as needed (for insomnia).   senna-docusate 8.6-50 MG tablet Commonly known as:  Senokot-S Take 1 tablet by mouth at bedtime as needed for mild  constipation.   traMADol 50 MG tablet Commonly known as:  ULTRAM Take 1 tablet (50 mg total) by mouth every 8 (eight) hours as needed.       Review of Systems  Constitutional: Negative for activity change, appetite change, chills, diaphoresis and fever.  HENT: Negative for congestion, sneezing, sore throat, trouble swallowing and voice change.   Respiratory: Negative for apnea, cough, choking, chest tightness, shortness of breath and wheezing.   Cardiovascular: Negative for chest pain, palpitations and leg swelling.  Gastrointestinal: Positive for abdominal pain. Negative for abdominal distention, constipation, diarrhea, nausea and vomiting.  Genitourinary: Negative for difficulty urinating, dysuria, frequency and urgency.  Musculoskeletal: Negative for back pain, gait problem and myalgias. Arthralgias: typical arthritis.  Skin: Negative for color change, pallor, rash and wound.  Neurological: Negative for dizziness, tremors, syncope, speech difficulty, weakness, numbness and headaches.  Psychiatric/Behavioral: Negative for agitation and behavioral problems.  All other systems reviewed and are negative.   Immunization History  Administered Date(s) Administered  . Influenza,inj,Quad PF,36+ Mos 05/10/2016  . Pneumococcal Conjugate-13 12/03/2013  . Pneumococcal Polysaccharide-23 02/19/1994  . Td 05/12/2006   Pertinent  Health Maintenance Due  Topic Date Due  . INFLUENZA VACCINE  Completed  . DEXA SCAN  Completed  . PNA vac Low Risk Adult  Completed   No flowsheet data found. Functional Status Survey:    Vitals:   06/15/16 0400  BP: (!) 126/47  Pulse: 67  Resp: 18  Temp: 98.6 F (37 C)  SpO2: 96%   There is no height or weight on file to calculate BMI. Physical Exam  Constitutional: She is oriented to person, place, and time. Vital signs are normal. She appears well-developed and well-nourished. She is active and cooperative. She does not appear ill. No distress.    HENT:  Head: Normocephalic and atraumatic.  Mouth/Throat: Uvula is midline, oropharynx is clear and moist and mucous membranes are normal. Mucous membranes are not pale, not dry and not cyanotic.  Eyes: Conjunctivae, EOM and lids are normal. Pupils are equal, round, and reactive to light.  Neck: Trachea normal, normal range of motion and full passive range of motion without pain. Neck supple. No JVD present. No tracheal deviation, no edema and no erythema present. No thyromegaly present.  Cardiovascular: Normal rate, regular rhythm, normal heart sounds, intact distal pulses and normal pulses.  Exam reveals no gallop, no distant heart sounds and no friction rub.   No murmur heard. Pulmonary/Chest: Effort normal and breath sounds normal. No accessory muscle usage. No respiratory distress. She has no decreased breath sounds. She has no wheezes. She has no rhonchi. She has no rales. She exhibits no tenderness.  Abdominal: Soft. Normal appearance and bowel sounds are normal. She exhibits no distension and no ascites. There  is tenderness in the epigastric area. There is guarding. There is no CVA tenderness.  Musculoskeletal: Normal range of motion. She exhibits no edema or tenderness.  Expected osteoarthritis, stiffness  Neurological: She is alert and oriented to person, place, and time. She has normal strength.  Skin: Skin is warm, dry and intact. No rash noted. She is not diaphoretic. No cyanosis or erythema. No pallor. Nails show no clubbing.  Psychiatric: She has a normal mood and affect. Her speech is normal and behavior is normal. Judgment and thought content normal. Cognition and memory are normal.  Nursing note and vitals reviewed.   Labs reviewed:  Recent Labs  05/06/16 0834  05/07/16 0448  05/31/16 1444  06/10/16 0730 06/11/16 0800 06/14/16 1639  NA 128*  < > 131*  < > 136  < > 142 142 142  K  --   --  4.1  < > 4.2  < > 3.0* 3.6 3.2*  CL  --   --  93*  < > 102  < > 113* 115* 108   CO2  --   --  30  < > 25  < > 24 19* 24  GLUCOSE  --   --  83  < > 133*  < > 82 64* 105*  BUN  --   --  18  < > 56*  < > 27* 24* 20  CREATININE  --   --  1.12*  < > 2.07*  < > 1.67* 1.75* 1.41*  CALCIUM  --   --  8.5*  < > 9.3  < > 8.3* 8.1* 8.7*  MG 1.2*  --  2.2  --  2.7*  --   --   --   --   < > = values in this interval not displayed.  Recent Labs  06/02/16 0600 06/10/16 0730 06/14/16 1639  AST 21 25 26   ALT 10* 10* 11*  ALKPHOS 45 44 52  BILITOT 0.4 0.3 0.6  PROT 5.8* 5.4* 6.6  ALBUMIN 2.7* 2.5* 2.9*    Recent Labs  06/02/16 0600 06/10/16 0730 06/14/16 1026  WBC 3.8 4.9 7.5  NEUTROABS 1.5 2.5 5.4  HGB 9.0* 8.7* 11.0*  HCT 28.5* 27.0* 32.6*  MCV 81.7 80.3 79.8*  PLT 191 145* 156   Lab Results  Component Value Date   TSH 0.701 05/04/2016   No results found for: HGBA1C Lab Results  Component Value Date   CHOL 120 05/08/2016   HDL 48 05/08/2016   LDLCALC 60 05/08/2016   TRIG 62 05/08/2016   CHOLHDL 2.5 05/08/2016    Significant Diagnostic Results in last 30 days:  Ct Abdomen Pelvis Wo Contrast  Result Date: 06/15/2016 CLINICAL DATA:  Nausea, vomiting and diarrhea. EXAM: CT ABDOMEN AND PELVIS WITHOUT CONTRAST TECHNIQUE: Multidetector CT imaging of the abdomen and pelvis was performed following the standard protocol without IV contrast. COMPARISON:  06/08/2016 FINDINGS: Lower chest: Large hiatal hernia with most of the stomach up in the chest. The gurney also contains part of the colon. No findings for obstruction or incarceration. The lung bases are grossly clear. There are scarring changes and atelectasis. Hepatobiliary: No focal hepatic lesions or intrahepatic biliary dilatation. The gallbladder is normal. No common bile duct dilatation. Pancreas: Moderate atrophy of the pancreas but no mass, inflammation or ductal dilatation. Spleen: Normal size.  No focal lesions. Adrenals/Urinary Tract: The adrenal glands and kidneys are stable. Small renal calculi but no  obstructing ureteral calculi or bladder calculi. Stomach/Bowel:  There is abnormal distal small bowel wall thickening and interstitial changes in the mesenteric fat. No obstruction. The colon is grossly normal. The appendix is normal. The terminal ileum is mildly thickened. Vascular/Lymphatic: Persistent soft tissue masses/ adenopathy in the root of the small bowel mesenteric. Stable calcified soft tissue lesion measuring 16 mm on image number 47. There are also enlarged adjacent lymph nodes measuring up to 22 mm. No other calcifications noted centrally on image number 50. Findings somewhat concerning for carcinoid tumor or lymphoma. Distal ileal enteritis is also possible but less likely with the lymphadenopathy. Reproductive: The uterus and ovaries are unremarkable and stable. Other: No inguinal mass or adenopathy. Small periumbilical abdominal wall hernia containing fat. No subcutaneous lesions. Musculoskeletal: Scoliosis and degenerative lumbar spondylosis with severe multilevel disc disease and facet disease. No acute bony findings. IMPRESSION: 1. Persistent bowel wall thickening involving the distal loops of ileum along with mild mesenteric inflammations/edema and enlarged lymph nodes in the small bowel mesenteric. Persistent calcified soft tissue lesion is also again demonstrated. Could not exclude lymphoma or carcinoid tumor. 2. No small bowel obstruction or free air. 3. Stable large hiatal hernia. 4. Suspect cholelithiasis. 5. Stable atherosclerotic calcifications involving the aorta and branch vessels and three-vessel coronary artery calcifications. Electronically Signed   By: Rudie MeyerP.  Gallerani M.D.   On: 06/15/2016 15:30   Ct Abdomen Pelvis Wo Contrast  Result Date: 06/08/2016 CLINICAL DATA:  Abdominal pain with nausea and vomiting. Chronic kidney disease. EXAM: CT ABDOMEN AND PELVIS WITHOUT CONTRAST TECHNIQUE: Multidetector CT imaging of the abdomen and pelvis was performed following the standard protocol  without IV contrast. COMPARISON:  Limited correlation made with [facility] pelvic radiograph 01/07/2008 and lumbar spine MRI 03/05/2006. FINDINGS: Lower chest: Coronary artery atherosclerosis and mitral annular calcifications are present. There are possible calcifications of the aortic valve. The heart size is normal. There is no pleural or pericardial effusion. Mild atelectasis or scarring is present at both lung bases. There is a large hiatal hernia with most of the stomach herniated into the posterior mediastinum. Some bowel is also herniated into the lower chest (probably the transverse colon). Hepatobiliary: The liver appears unremarkable as imaged in the noncontrast state. No evidence of gallstones, gallbladder wall thickening or biliary dilatation. Pancreas: Unremarkable. No pancreatic ductal dilatation or surrounding inflammatory changes. Spleen: Normal in size without focal abnormality. Adrenals/Urinary Tract: Both adrenal glands appear normal. Both kidneys demonstrate mild cortical thinning. There is no evidence of renal mass, urinary tract calculus, hydronephrosis or perinephric soft tissue stranding. The bladder is moderately distended without wall thickening. Stomach/Bowel: No enteric contrast was administered. As above, there is a large hiatal hernia which contains most of the stomach and probably a portion of the transverse colon. No evidence of bowel obstruction or incarceration. There is suggested wall thickening of the small bowel in the right lower quadrant (images 18-25 of series 5. There are inflammatory changes in the surrounding mesentery. The appendix appears normal. The colon is decompressed without apparent wall thickening. Vascular/Lymphatic: Moderate aortic and branch vessel atherosclerosis. As above, there are inflammatory changes in the right lower quadrant mesentery with enlarged mesenteric nodes or soft tissue masses which are not retractile. One measuring 1.6 cm on image 45 demonstrates  central calcification. There is an elongated soft tissue nodule measuring up to 4.2 cm on image 42. No retroperitoneal lymphadenopathy. Reproductive: The uterus and adnexa appear unremarkable. No evidence of adnexal mass. Other: No ascites, peritoneal nodularity or focal extraluminal fluid collections are identified. There is no  free air. Musculoskeletal: No acute osseous findings. There are extensive degenerative changes throughout the lumbar spine associated with a convex left scoliosis. IMPRESSION: 1. Apparent inflammatory process in the false pelvis with possible distal small bowel wall thickening, soft tissue stranding in the mesenteric fat and partially calcified lymphadenopathy. Differential considerations include enteritis with reactive adenopathy and neoplasm (including carcinoid tumor and lymphoma). Follow up CT with enteric contrast may be helpful. 2. Large hiatal hernia containing most of the stomach and a portion of the transverse colon. No evidence of bowel obstruction. 3. The appendix appears normal. 4. Moderate atherosclerosis. Electronically Signed   By: Carey Bullocks M.D.   On: 06/08/2016 15:37       Assessment/Plan 1. Epigastric pain  Continue Bentyl 20 mg po QID  Continue GI Cocktail 30 mL po QID continues  Zofran 4 mg po Q 6 hours prn  Phenergan 12.5 mg IM Q 4 hrs prn- give 1 dose now (prior to CT to enable drinking oral contrast)   2. CKD Stage IV  Fluids completed  re-check labs as appropriate  Family/ staff Communication:   Total Time:  Documentation:  Face to Face:  Family/Phone:   Labs/tests ordered:  CT abdomen with oral contrast  Medication list reviewed and assessed for continued appropriateness.  Brynda Rim, NP-C Geriatrics [facility] [phone] [address], [place] [address] Cell Phone (Mon-Fri 8am-5pm):  [phone] On Call:  [phone] & follow prompts after 5pm & weekends Office Phone:   [phone] Office Fax:  [phone]

## 2016-07-18 DEATH — deceased

## 2017-08-17 IMAGING — CR DG SHOULDER 2+V*L*
1 series · 3 of 3 positions shown · non-contrast
Comparison: None.

CLINICAL DATA: Fall this morning. Left shoulder pain. Initial
encounter.

EXAM:
LEFT SHOULDER - 2+ VIEW

[Series 1: dg shoulder left · 0.14mm/px · 3 of 3 slices shown]
[im 1/3]
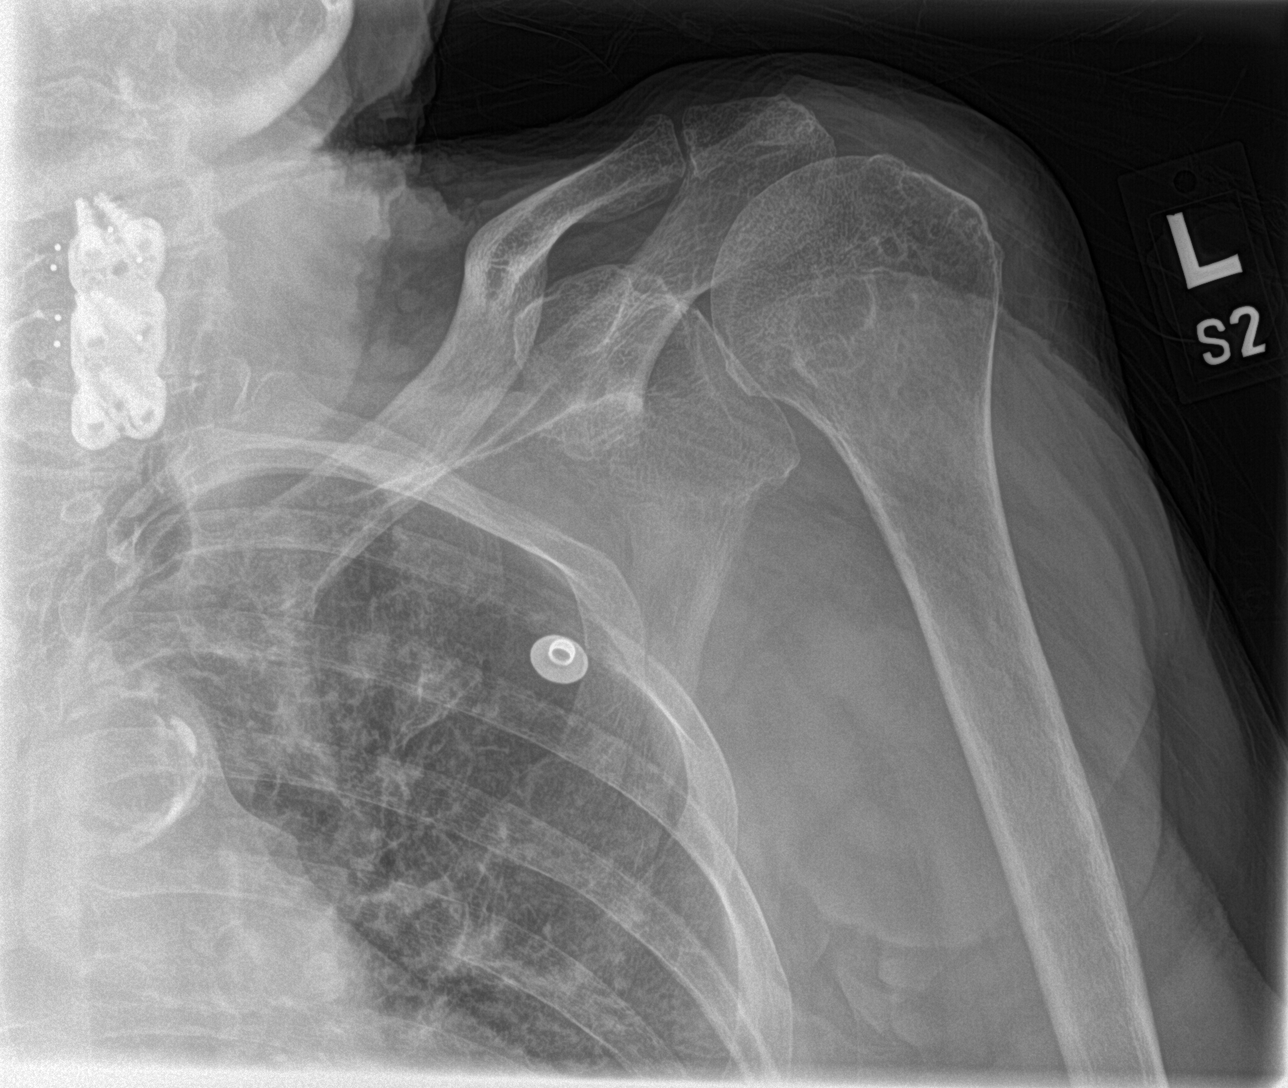
[im 2/3]
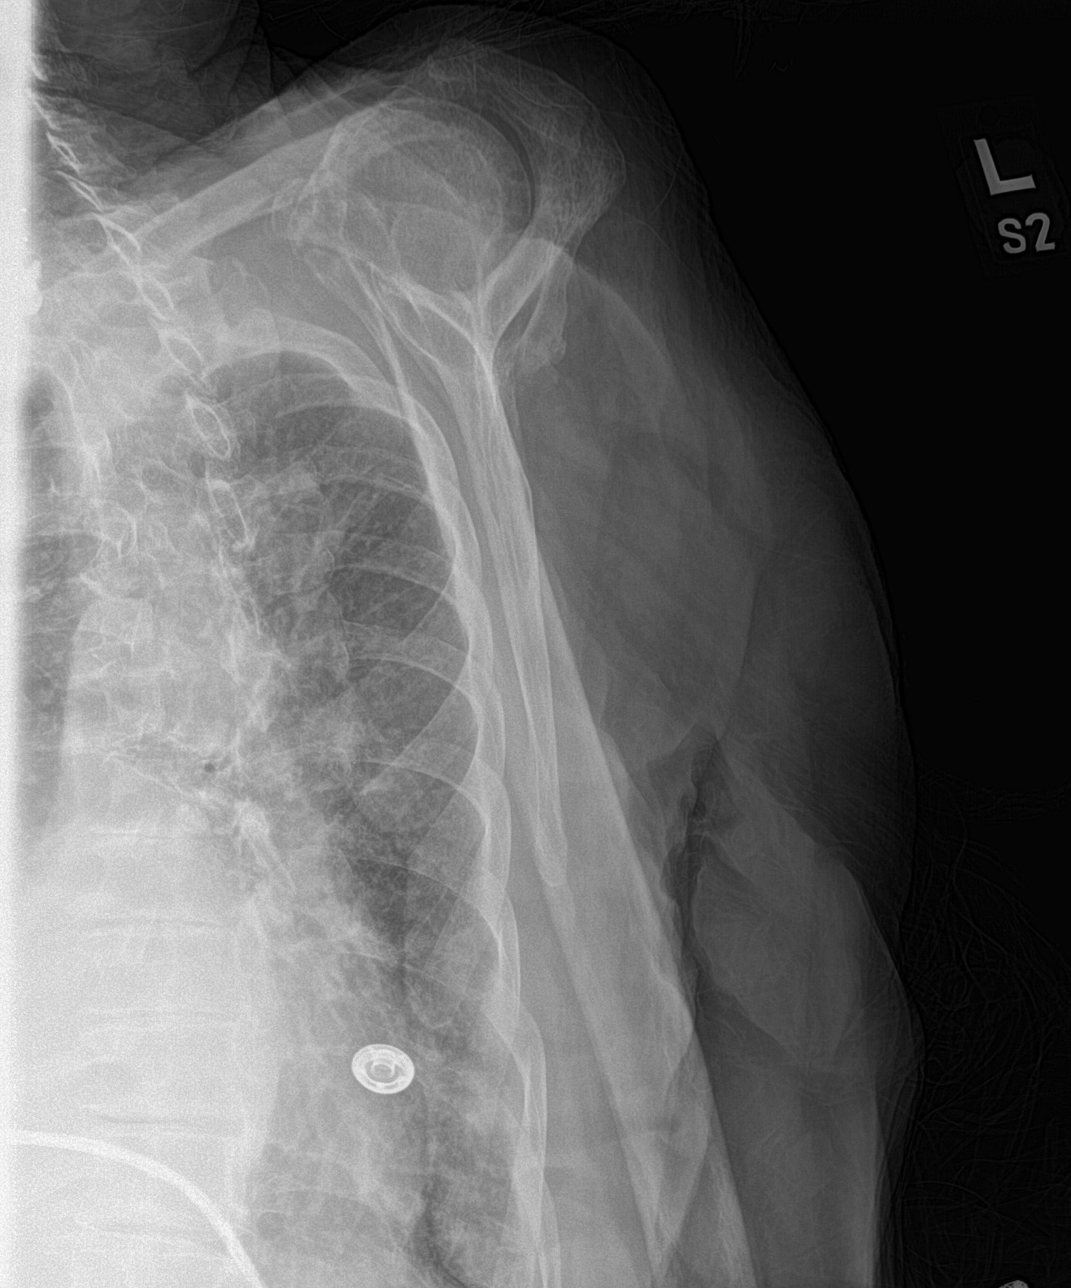
[im 3/3]
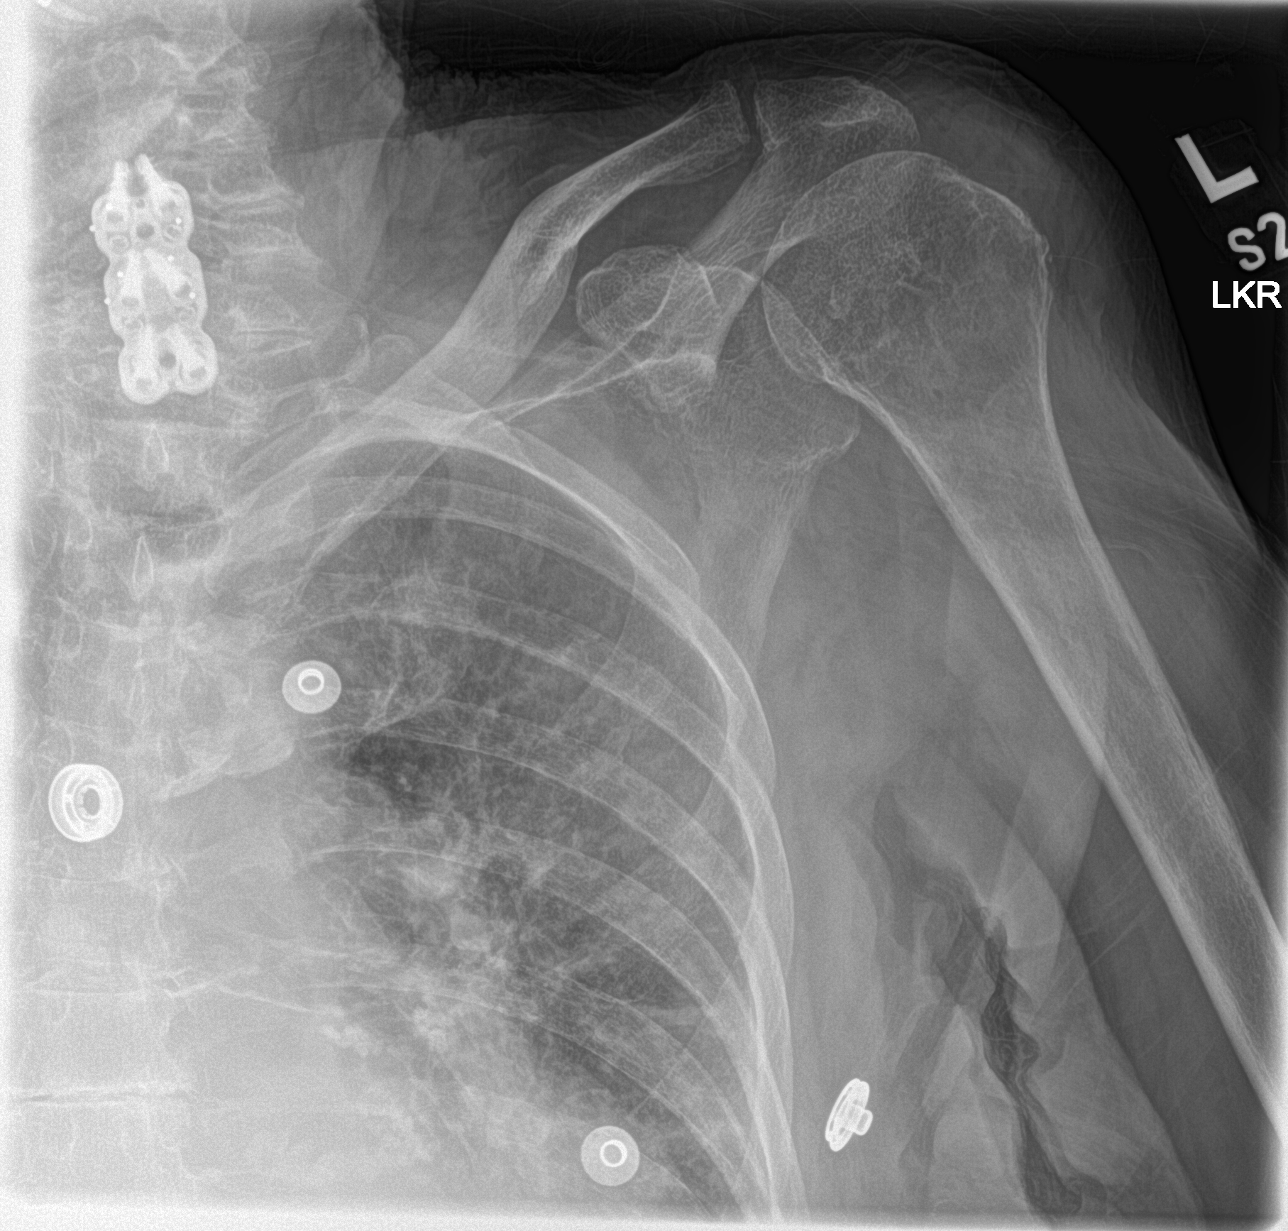

[3 of 3 positions shown; findings below may reference images not displayed]

FINDINGS: There is no evidence of fracture or dislocation. Generalized
osteopenia noted. High-riding humeral head seen, consistent with
chronic rotator cuff tear or atrophy.
IMPRESSION: No acute findings.

Findings consistent with chronic rotator cuff tear or atrophy.

## 2017-08-18 IMAGING — DX DG CHEST 1V PORT
1 series · 1 of 1 positions shown · non-contrast
Comparison: 01/15/2008

CLINICAL DATA: Shortness of breath.

EXAM:
PORTABLE CHEST 1 VIEW

[chest ap]
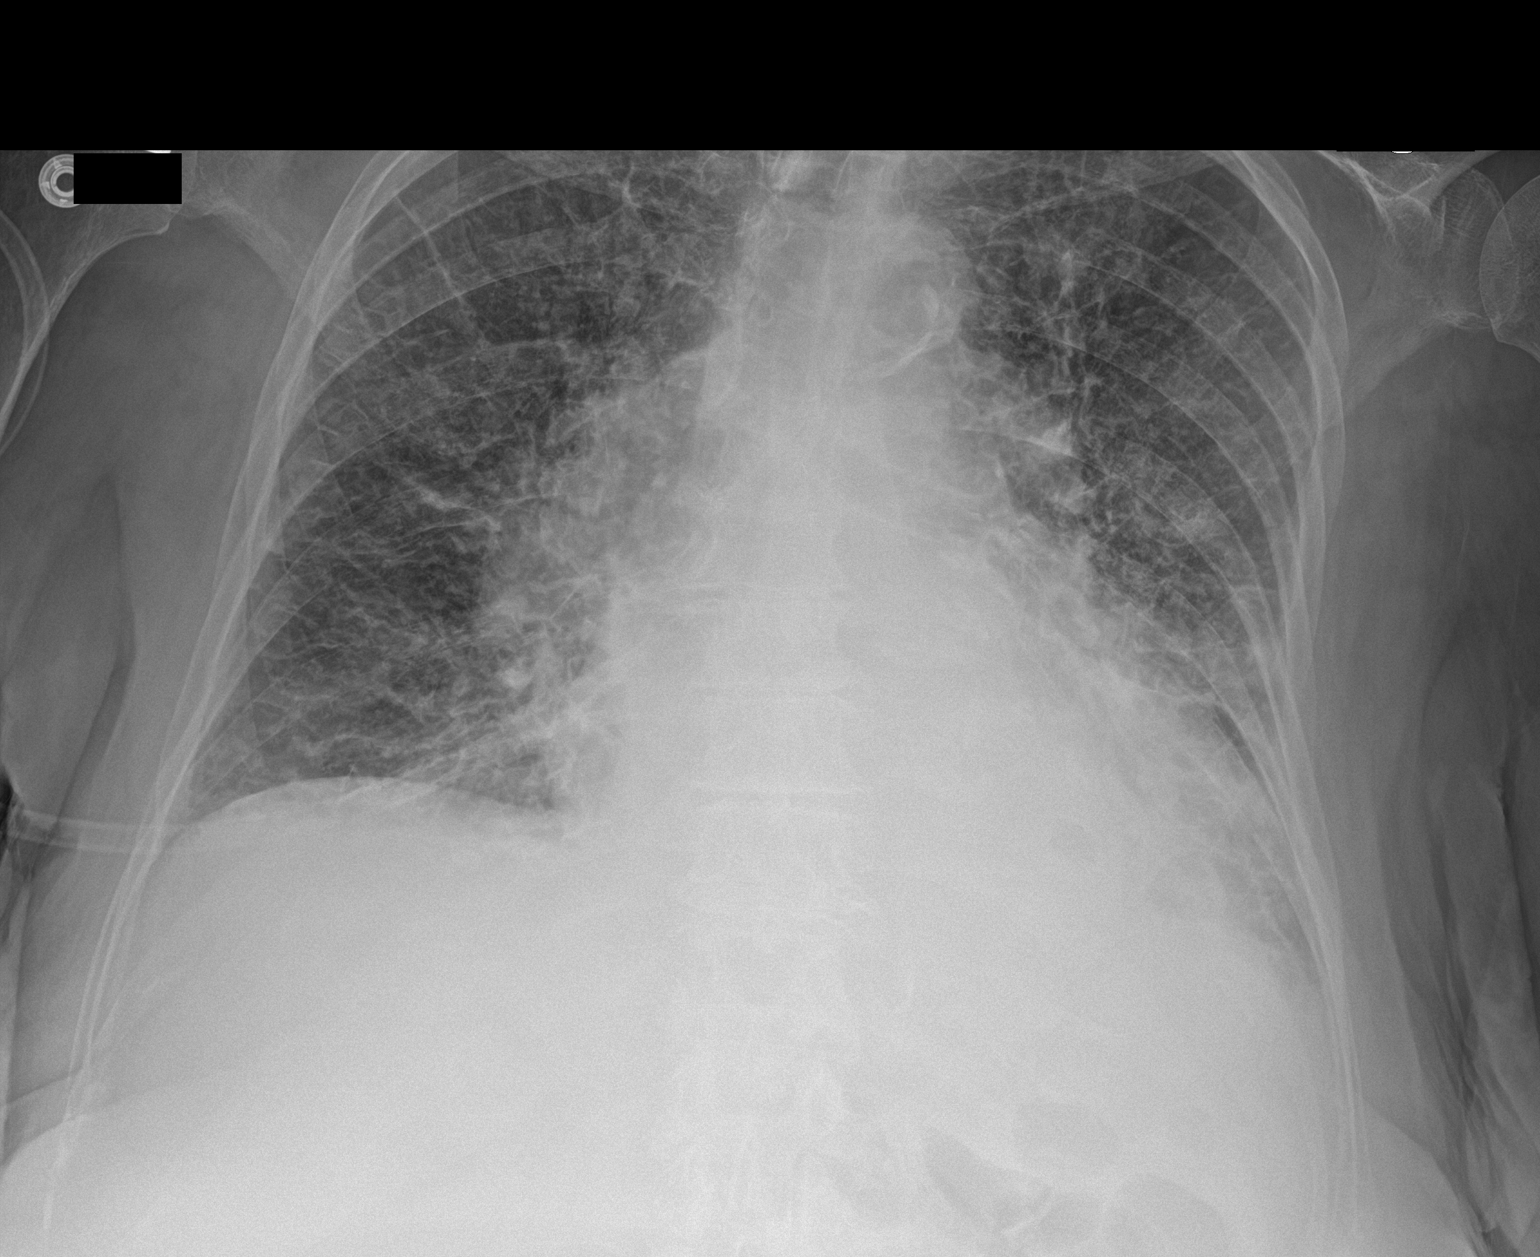

[1 of 1 positions shown; findings below may reference images not displayed]

FINDINGS: The heart is enlarged. There is atherosclerosis of the thoracic
aorta. Diffuse bilateral reticular opacities with Kerley B-lines
consistent with pulmonary edema. Probable left pleural effusion.
Left lung base suboptimally assessed due to soft tissue attenuation
and portable technique. No confluent airspace disease elsewhere. No
pneumothorax. Degenerative change in the spine.
IMPRESSION: 1. Cardiomegaly with diffuse reticular opacities most consistent
with pulmonary edema. Probable left pleural effusion. Findings
suspicious for CHF.
2. Thoracic aortic atherosclerosis.

## 2017-09-21 IMAGING — CT CT ABD-PELV W/O CM
2 of 4 series · 15 of 46 positions shown, 17 images · non-contrast
Comparison: Limited correlation made with [facility] pelvic radiograph
01/07/2008 and lumbar spine MRI 03/05/2006.

CLINICAL DATA: Abdominal pain with nausea and vomiting. Chronic
kidney disease.

EXAM:
CT ABDOMEN AND PELVIS WITHOUT CONTRAST
TECHNIQUE: Multidetector CT imaging of the abdomen and pelvis was performed
following the standard protocol without IV contrast.

[Series 2: routine abd/pel wo · axial · 0.83mm/px · z∈[-1024,-660]mm · 12 of 81 slices shown, 14 images]
[im 4/81  soft-tissue]
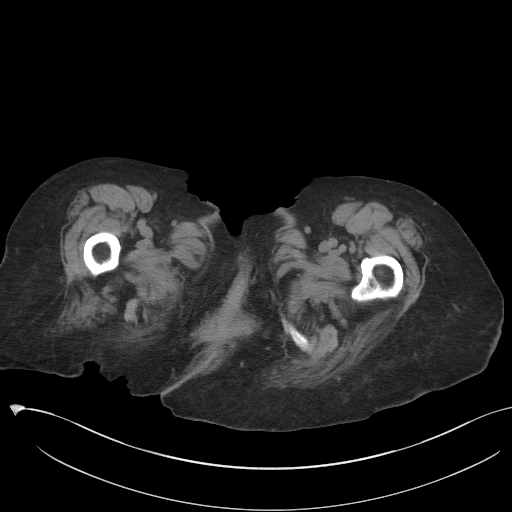
[im 4/81  bone]
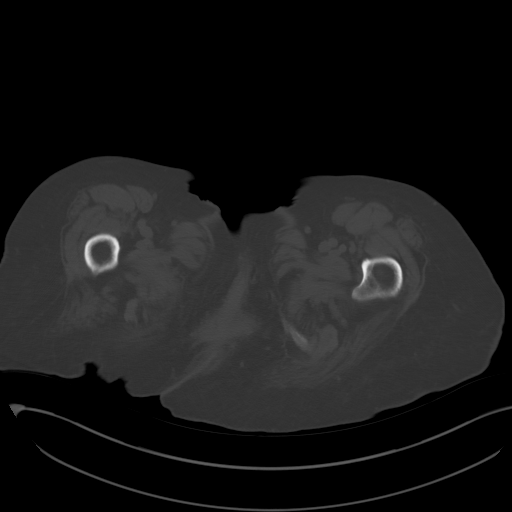
[im 11/81  soft-tissue]
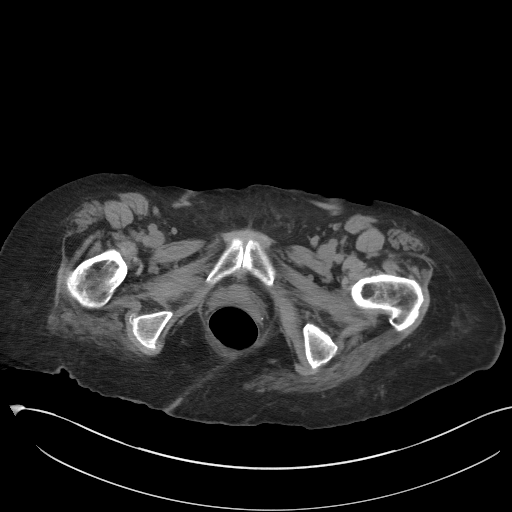
[im 18/81  soft-tissue]
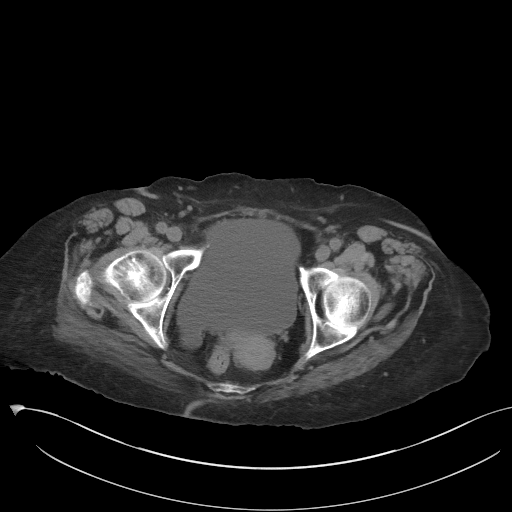
[im 25/81  soft-tissue]
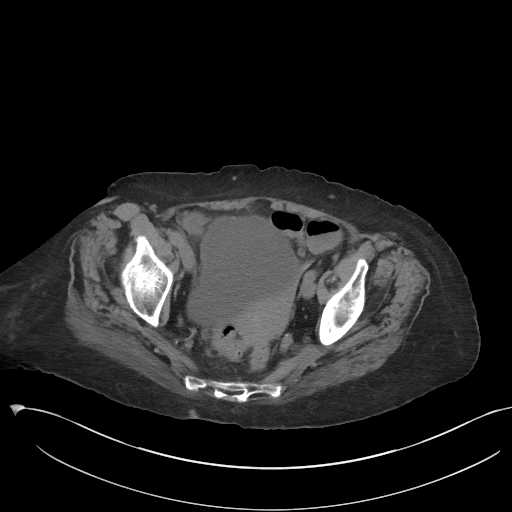
[im 32/81  soft-tissue]
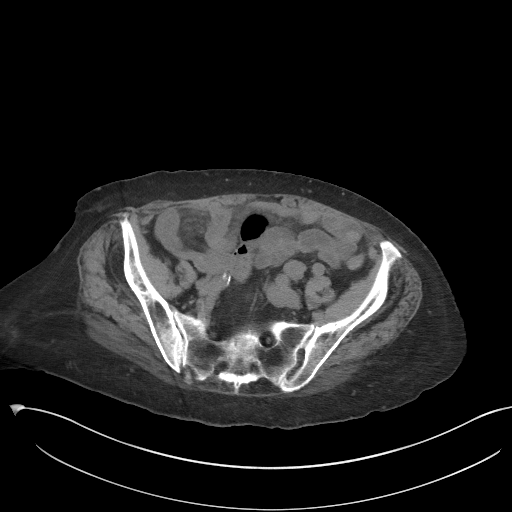
[im 39/81  soft-tissue]
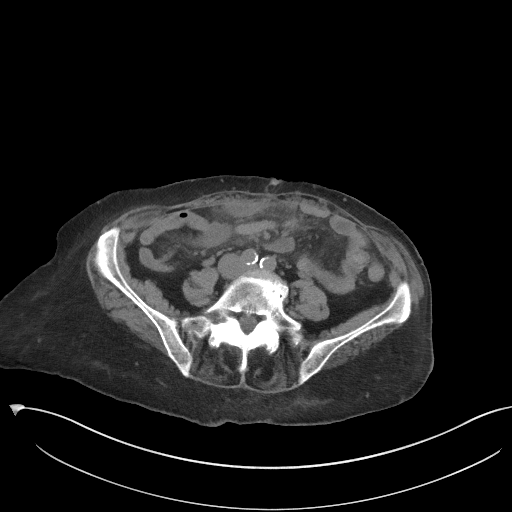
[im 42/81  soft-tissue]
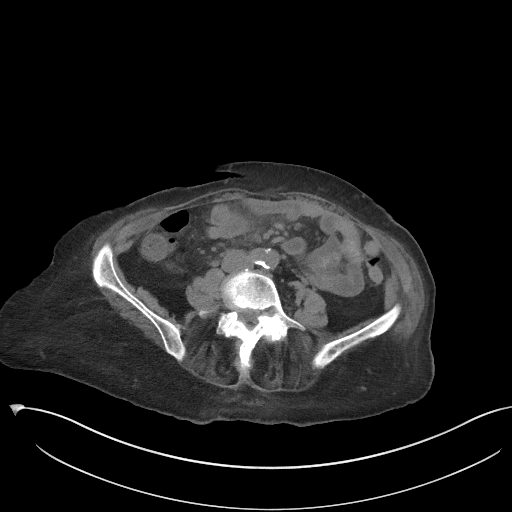
[im 49/81  soft-tissue]
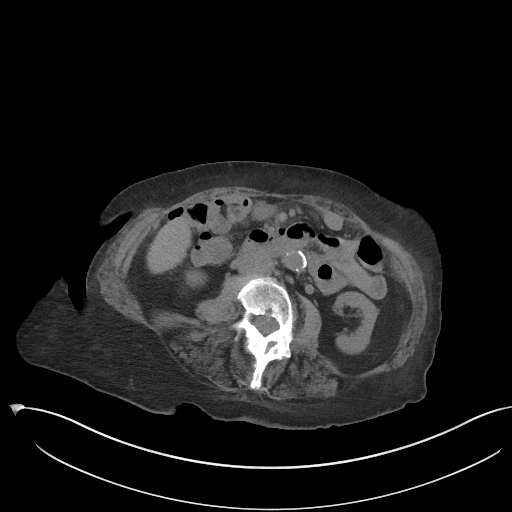
[im 56/81  soft-tissue]
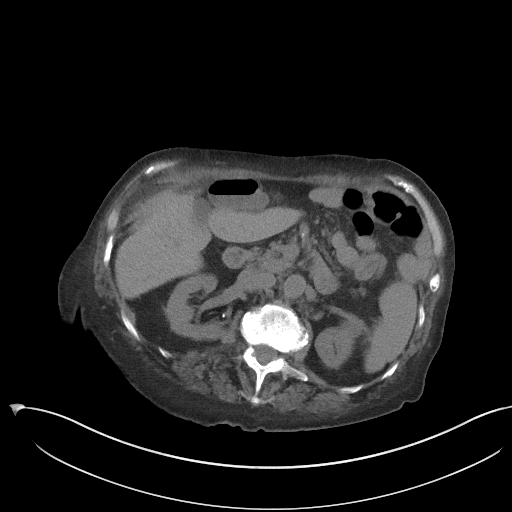
[im 56/81  bone]
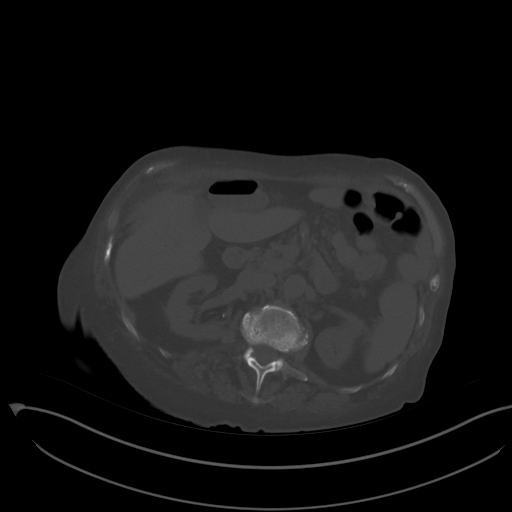
[im 63/81  soft-tissue]
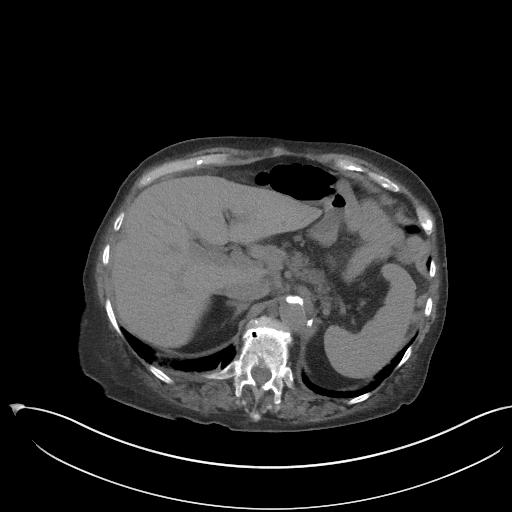
[im 70/81  soft-tissue]
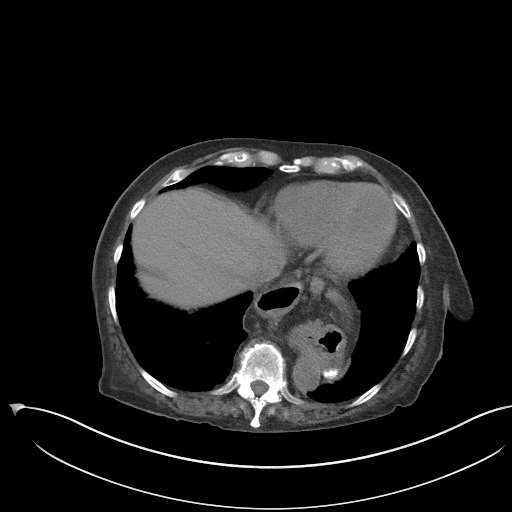
[im 77/81  soft-tissue]
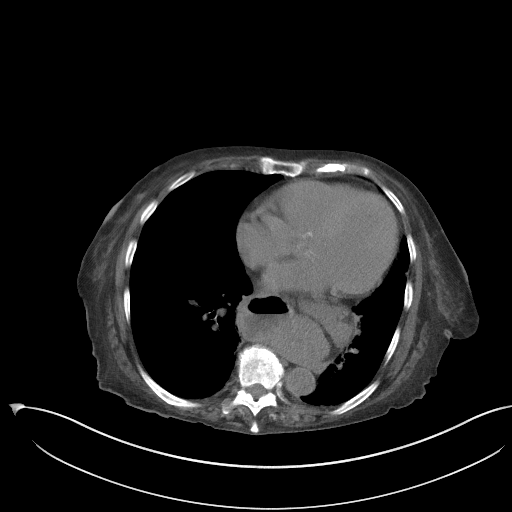

[Series 5: coronal st · coronal · 0.82mm/px · 3 of 77 slices shown]
[im 26/77  soft-tissue]
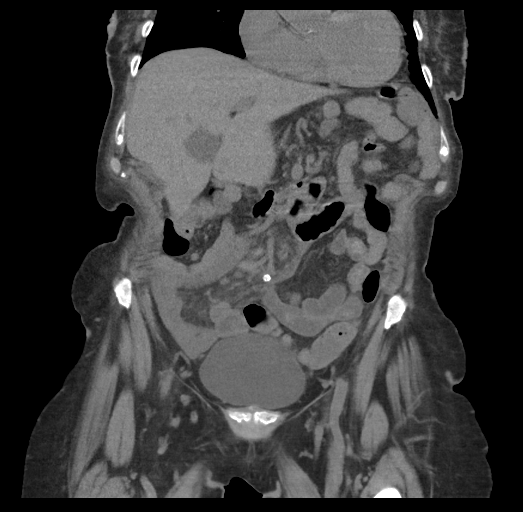
[im 34/77  soft-tissue]
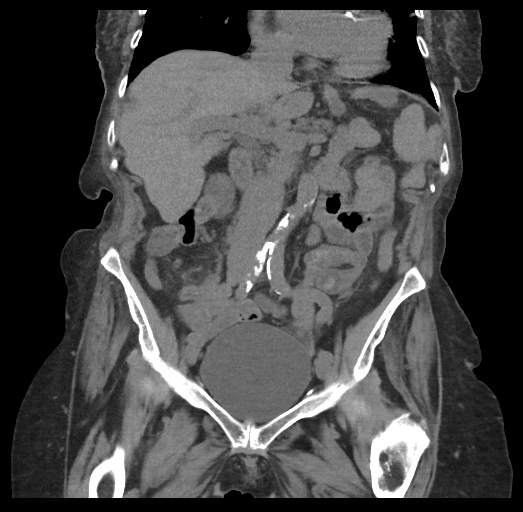
[im 43/77  soft-tissue]
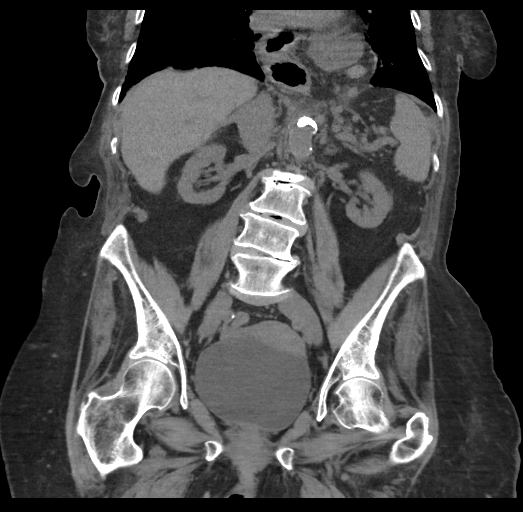

[15 of 46 positions shown; findings below may reference images not displayed]

FINDINGS: Lower chest: Coronary artery atherosclerosis and mitral annular
calcifications are present. There are possible calcifications of the
aortic valve. The heart size is normal. There is no pleural or
pericardial effusion. Mild atelectasis or scarring is present at
both lung bases. There is a large hiatal hernia with most of the
stomach herniated into the posterior mediastinum. Some bowel is also
herniated into the lower chest (probably the transverse colon).

Hepatobiliary: The liver appears unremarkable as imaged in the
noncontrast state. No evidence of gallstones, gallbladder wall
thickening or biliary dilatation.

Pancreas: Unremarkable. No pancreatic ductal dilatation or
surrounding inflammatory changes.

Spleen: Normal in size without focal abnormality.

Adrenals/Urinary Tract: Both adrenal glands appear normal. Both
kidneys demonstrate mild cortical thinning. There is no evidence of
renal mass, urinary tract calculus, hydronephrosis or perinephric
soft tissue stranding. The bladder is moderately distended without
wall thickening.

Stomach/Bowel: No enteric contrast was administered. As above, there
is a large hiatal hernia which contains most of the stomach and
probably a portion of the transverse colon. No evidence of bowel
obstruction or incarceration. There is suggested wall thickening of
the small bowel in the right lower quadrant (images 18-25 of series
5. There are inflammatory changes in the surrounding mesentery. The
appendix appears normal. The colon is decompressed without apparent
wall thickening.

Vascular/Lymphatic: Moderate aortic and branch vessel
atherosclerosis. As above, there are inflammatory changes in the
right lower quadrant mesentery with enlarged mesenteric nodes or
soft tissue masses which are not retractile. One measuring 1.6 cm on
image 45 demonstrates central calcification. There is an elongated
soft tissue nodule measuring up to 4.2 cm on image 42. No
retroperitoneal lymphadenopathy.

Reproductive: The uterus and adnexa appear unremarkable. No evidence
of adnexal mass.

Other: No ascites, peritoneal nodularity or focal extraluminal fluid
collections are identified. There is no free air.

Musculoskeletal: No acute osseous findings. There are extensive
degenerative changes throughout the lumbar spine associated with a
convex left scoliosis.
IMPRESSION: 1. Apparent inflammatory process in the false pelvis with possible
distal small bowel wall thickening, soft tissue stranding in the
mesenteric fat and partially calcified lymphadenopathy. Differential
considerations include enteritis with reactive adenopathy and
neoplasm (including carcinoid tumor and lymphoma). Follow up CT with
enteric contrast may be helpful.
2. Large hiatal hernia containing most of the stomach and a portion
of the transverse colon. No evidence of bowel obstruction.
3. The appendix appears normal.
4. Moderate atherosclerosis.

## 2017-12-22 IMAGING — US US EXTREM LOW VENOUS*R*
1 series · 13 of 24 positions shown · non-contrast
Comparison: None.

CLINICAL DATA: Right lower extremity edema.



[Series 1: us extrem low venous*right* · 0.08mm/px · 13 of 34 slices shown]
[im 1/34]
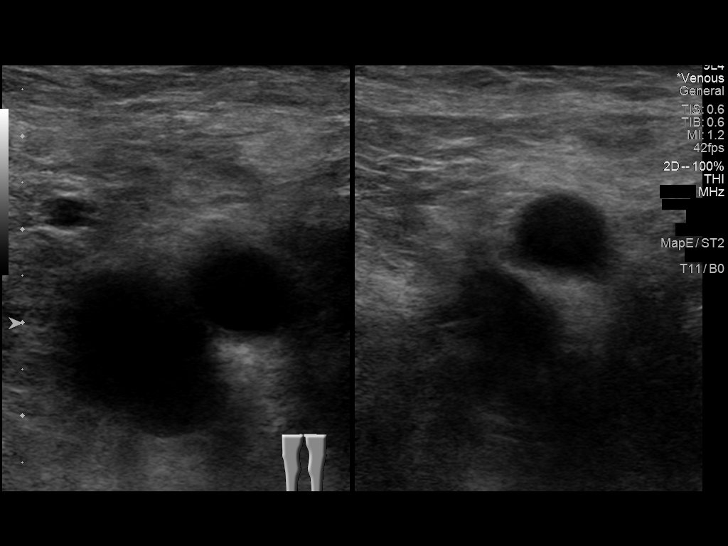
[im 3/34]
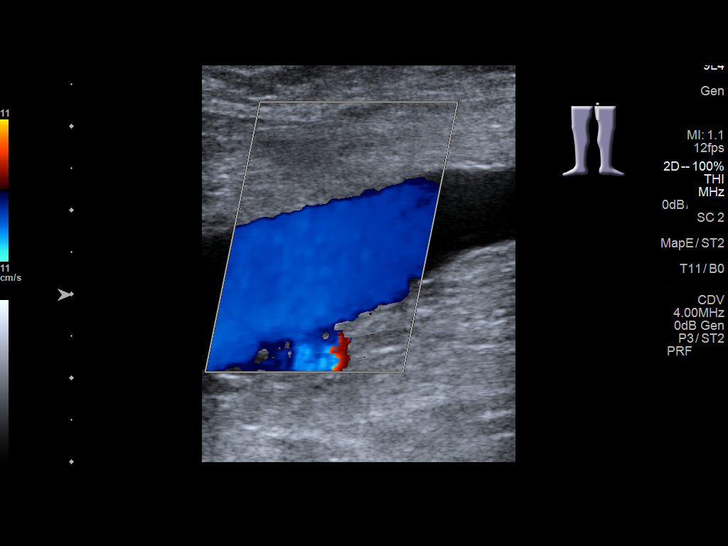
[im 6/34]
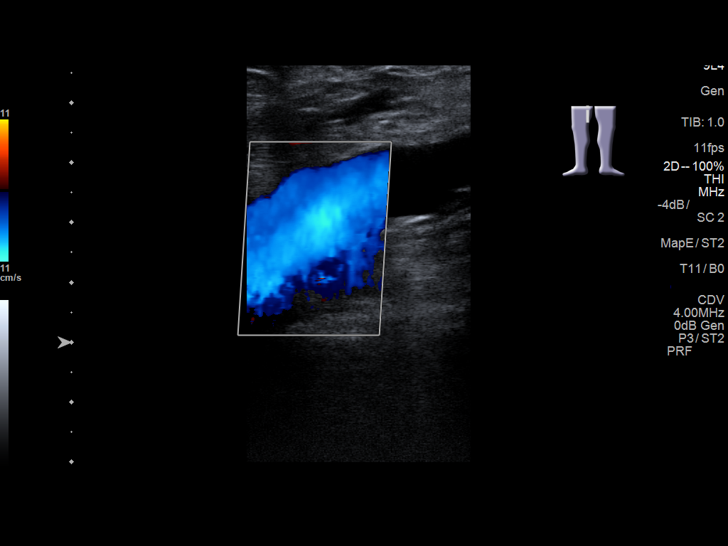
[im 9/34]
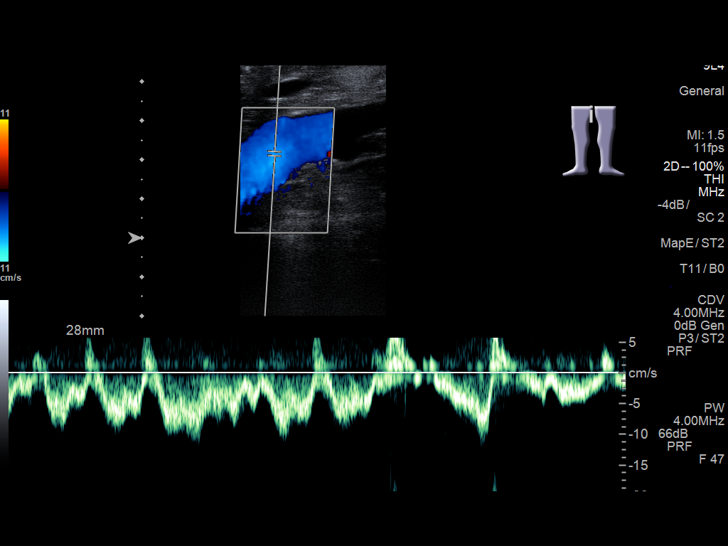
[im 12/34]
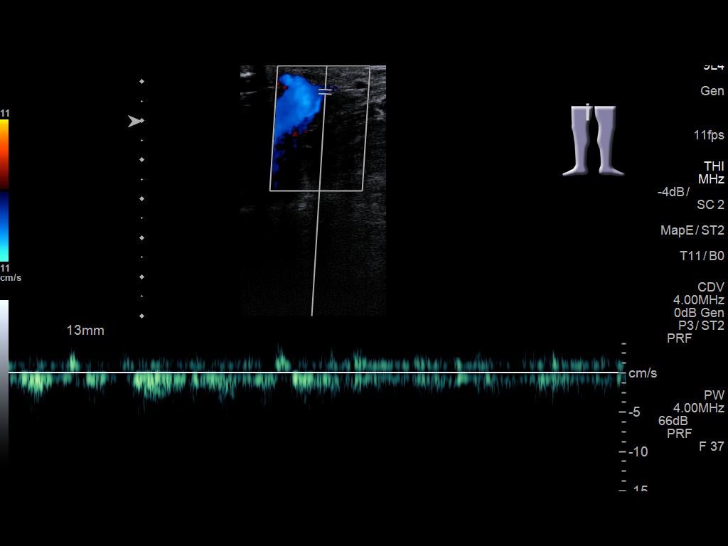
[im 15/34]
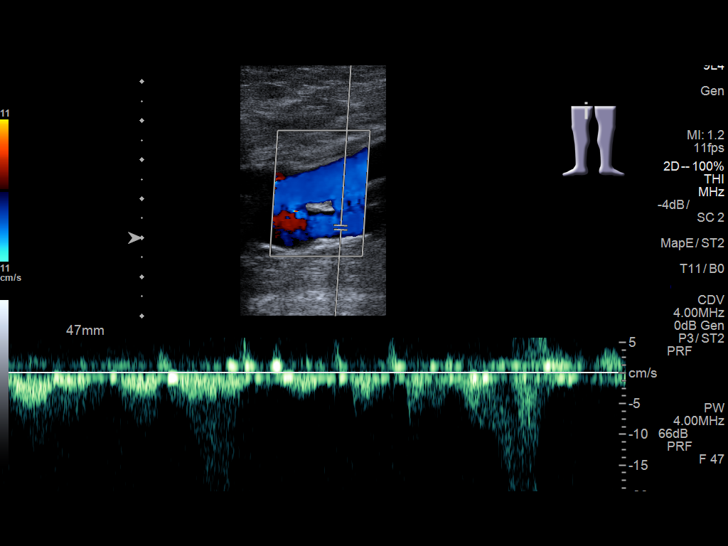
[im 18/34]
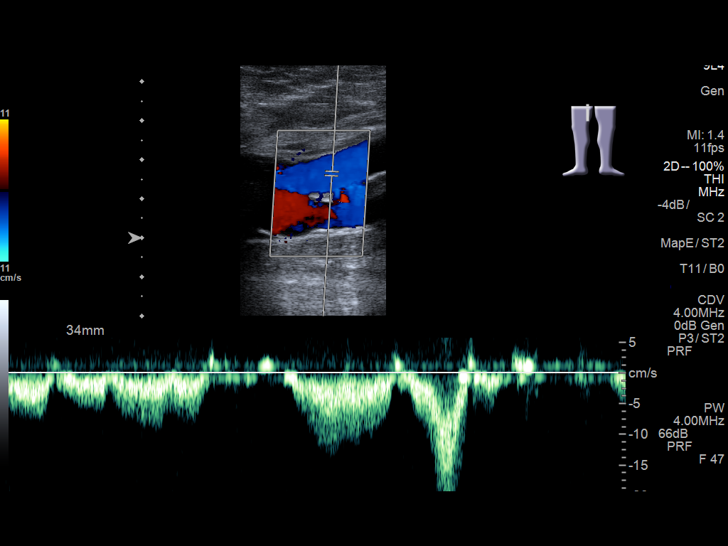
[im 19/34]
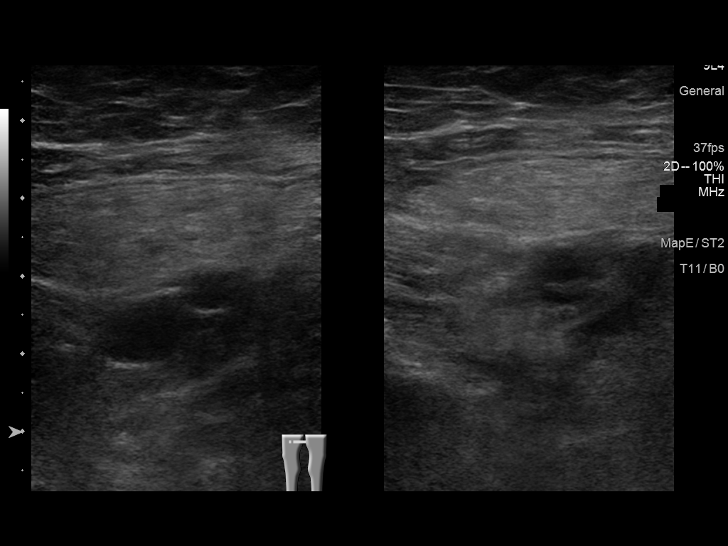
[im 22/34]
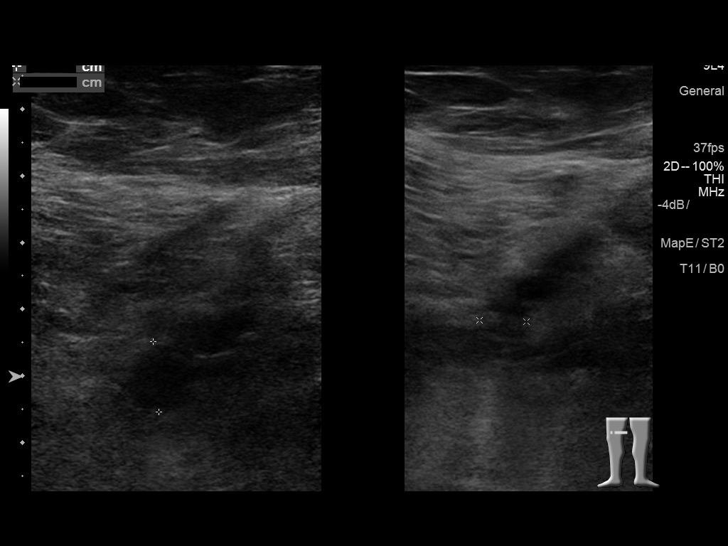
[im 25/34]
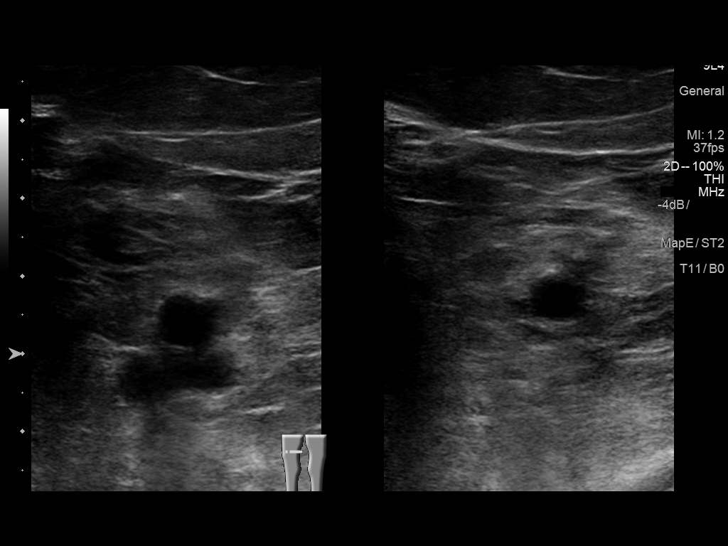
[im 28/34]
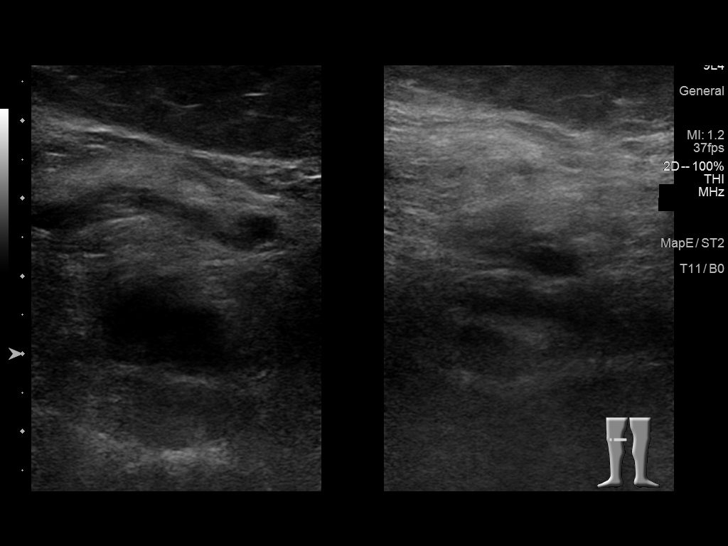
[im 31/34]
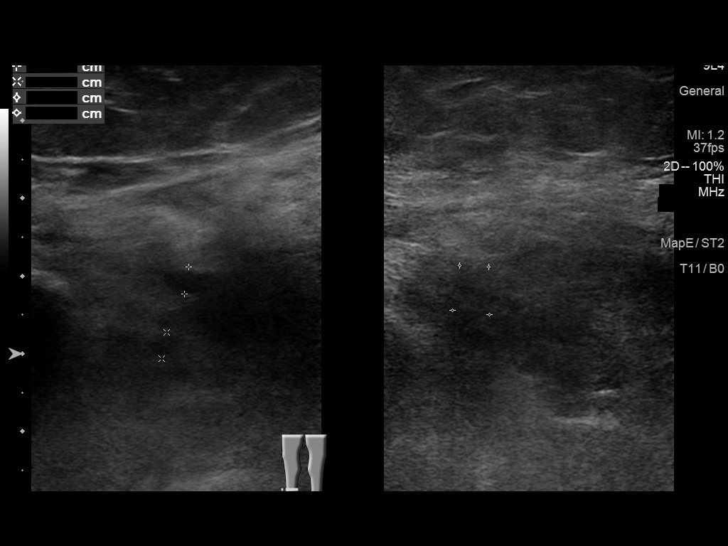
[im 34/34]
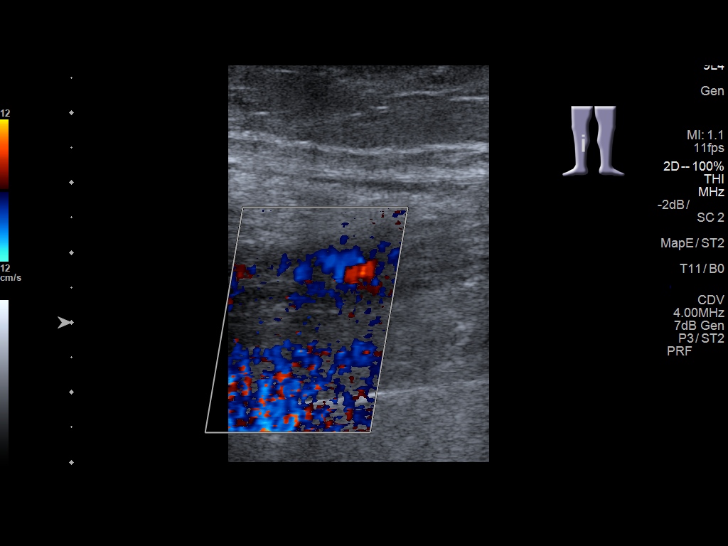

[13 of 24 positions shown; findings below may reference images not displayed]

FINDINGS: Contralateral Common Femoral Vein: Left common femoral vein is
patent without thrombus.

Common Femoral Vein: No evidence of thrombus. Normal
compressibility, respiratory phasicity and response to augmentation.

Saphenofemoral Junction: No evidence of thrombus. Normal
compressibility and flow on color Doppler imaging.

Profunda Femoral Vein: No evidence of thrombus. Normal
compressibility and flow on color Doppler imaging.

Femoral Vein: No evidence of thrombus. Normal compressibility,
respiratory phasicity and response to augmentation.

Popliteal Vein: No evidence of thrombus. Normal compressibility,
respiratory phasicity and response to augmentation.

Calf Veins: No evidence of thrombus. Normal compressibility and flow
on color Doppler imaging.
IMPRESSION: Negative for deep venous thrombosis in right lower extremity.
